# Patient Record
Sex: Female | Born: 1975 | Race: White | Hispanic: No | Marital: Married | Smoking: Never smoker
Health system: Southern US, Community
[De-identification: ages and names within clinical notes are randomized; demographics above are authoritative.]

## PROBLEM LIST (undated history)

## (undated) ENCOUNTER — Inpatient Hospital Stay (HOSPITAL_COMMUNITY): Payer: Self-pay

## (undated) ENCOUNTER — Inpatient Hospital Stay (HOSPITAL_COMMUNITY): Payer: PRIVATE HEALTH INSURANCE

## (undated) DIAGNOSIS — O99345 Other mental disorders complicating the puerperium: Secondary | ICD-10-CM

## (undated) DIAGNOSIS — F329 Major depressive disorder, single episode, unspecified: Secondary | ICD-10-CM

## (undated) DIAGNOSIS — F32A Depression, unspecified: Secondary | ICD-10-CM

## (undated) DIAGNOSIS — F53 Postpartum depression: Secondary | ICD-10-CM

## (undated) DIAGNOSIS — F5105 Insomnia due to other mental disorder: Principal | ICD-10-CM

## (undated) DIAGNOSIS — E039 Hypothyroidism, unspecified: Secondary | ICD-10-CM

## (undated) DIAGNOSIS — F419 Anxiety disorder, unspecified: Secondary | ICD-10-CM

## (undated) HISTORY — DX: Major depressive disorder, single episode, unspecified: F32.9

## (undated) HISTORY — PX: CHOLECYSTECTOMY: SHX55

## (undated) HISTORY — PX: WISDOM TOOTH EXTRACTION: SHX21

## (undated) HISTORY — DX: Other mental disorders complicating the puerperium: O99.345

## (undated) HISTORY — DX: Depression, unspecified: F32.A

## (undated) HISTORY — DX: Anxiety disorder, unspecified: F41.9

## (undated) HISTORY — DX: Postpartum depression: F53.0

## (undated) HISTORY — DX: Hypothyroidism, unspecified: E03.9

## (undated) HISTORY — DX: Insomnia due to other mental disorder: F51.05

---

## 2013-02-13 NOTE — L&D Delivery Note (Signed)
Patient is 38 y.o. [id number] admitted for IOL 2/2 prolonged pregnancy induced with cytotec => AROM   Delivery Note At 6:57 PM a viable female was delivered via Vaginal, Spontaneous Delivery (Presentation: Left Occiput Anterior).  APGAR: 9, 9; weight  pending  Placenta status: Intact, Spontaneous.  Cord: 3 vessels with the following complications: None  Anesthesia: Epidural  Episiotomy: None Lacerations: small hemostatic/well approximated clitoral laceration Suture Repair: none Est. Blood Loss (mL): 300  Mom to postpartum.  Baby to Couplet care / Skin to Skin.  Gabrielle Murphy ROCIO 12/27/2013, 7:57 PM

## 2013-05-09 ENCOUNTER — Ambulatory Visit (INDEPENDENT_AMBULATORY_CARE_PROVIDER_SITE_OTHER): Payer: PRIVATE HEALTH INSURANCE | Admitting: Advanced Practice Midwife

## 2013-05-09 ENCOUNTER — Encounter: Payer: Self-pay | Admitting: Advanced Practice Midwife

## 2013-05-09 VITALS — BP 100/59 | Ht 65.0 in | Wt 142.0 lb

## 2013-05-09 DIAGNOSIS — E039 Hypothyroidism, unspecified: Secondary | ICD-10-CM

## 2013-05-09 DIAGNOSIS — Z3480 Encounter for supervision of other normal pregnancy, unspecified trimester: Secondary | ICD-10-CM

## 2013-05-09 DIAGNOSIS — Z3481 Encounter for supervision of other normal pregnancy, first trimester: Secondary | ICD-10-CM | POA: Insufficient documentation

## 2013-05-09 NOTE — Progress Notes (Signed)
p-63  Last pap 8/14 and wnl     Bedside /S showed IUP with FHT of 163 and CRL of 14.639mm

## 2013-05-09 NOTE — Patient Instructions (Addendum)
Pregnancy - First Trimester  During sexual intercourse, millions of sperm go into the vagina. Only 1 sperm will penetrate and fertilize the female egg while it is in the Fallopian tube. One week later, the fertilized egg implants into the wall of the uterus. An embryo begins to develop into a baby. At 6 to 8 weeks, the eyes and face are formed and the heartbeat can be seen on ultrasound. At the end of 12 weeks (first trimester), all the baby's organs are formed. Now that you are pregnant, you will want to do everything you can to have a healthy baby. Two of the most important things are to get good prenatal care and follow your caregiver's instructions. Prenatal care is all the medical care you receive before the baby's birth. It is given to prevent, find, and treat problems during the pregnancy and childbirth.  PRENATAL EXAMS  · During prenatal visits, your weight, blood pressure, and urine are checked. This is done to make sure you are healthy and progressing normally during the pregnancy.  · A pregnant woman should gain 25 to 35 pounds during the pregnancy. However, if you are overweight or underweight, your caregiver will advise you regarding your weight.  · Your caregiver will ask and answer questions for you.  · Blood work, cervical cultures, other necessary tests, and a Pap test are done during your prenatal exams. These tests are done to check on your health and the probable health of your baby. Tests are strongly recommended and done for HIV with your permission. This is the virus that causes AIDS. These tests are done because medicines can be given to help prevent your baby from being born with this infection should you have been infected without knowing it. Blood work is also used to find out your blood type, previous infections, and follow your blood levels (hemoglobin).  · Low hemoglobin (anemia) is common during pregnancy. Iron and vitamins are given to help prevent this. Later in the pregnancy, blood  tests for diabetes will be done along with any other tests if any problems develop.  · You may need other tests to make sure you and the baby are doing well.  CHANGES DURING THE FIRST TRIMESTER   Your body goes through many changes during pregnancy. They vary from person to person. Talk to your caregiver about changes you notice and are concerned about. Changes can include:  · Your menstrual period stops.  · The egg and sperm carry the genes that determine what you look like. Genes from you and your partner are forming a baby. The female genes determine whether the baby is a boy or a girl.  · Your body increases in girth and you may feel bloated.  · Feeling sick to your stomach (nauseous) and throwing up (vomiting). If the vomiting is uncontrollable, call your caregiver.  · Your breasts will begin to enlarge and become tender.  · Your nipples may stick out more and become darker.  · The need to urinate more. Painful urination may mean you have a bladder infection.  · Tiring easily.  · Loss of appetite.  · Cravings for certain kinds of food.  · At first, you may gain or lose a couple of pounds.  · You may have changes in your emotions from day to day (excited to be pregnant or concerned something may go wrong with the pregnancy and baby).  · You may have more vivid and strange dreams.  HOME CARE INSTRUCTIONS   ·   It is very important to avoid all smoking, alcohol and non-prescribed drugs during your pregnancy. These affect the formation and growth of the baby. Avoid chemicals while pregnant to ensure the delivery of a healthy infant.  · Start your prenatal visits by the 12th week of pregnancy. They are usually scheduled monthly at first, then more often in the last 2 months before delivery. Keep your caregiver's appointments. Follow your caregiver's instructions regarding medicine use, blood and lab tests, exercise, and diet.  · During pregnancy, you are providing food for you and your baby. Eat regular, well-balanced  meals. Choose foods such as meat, fish, milk and other low fat dairy products, vegetables, fruits, and whole-grain breads and cereals. Your caregiver will tell you of the ideal weight gain.  · You can help morning sickness by keeping soda crackers at the bedside. Eat a couple before arising in the morning. You may want to use the crackers without salt on them.  · Eating 4 to 5 small meals rather than 3 large meals a day also may help the nausea and vomiting.  · Drinking liquids between meals instead of during meals also seems to help nausea and vomiting.  · A physical sexual relationship may be continued throughout pregnancy if there are no other problems. Problems may be early (premature) leaking of amniotic fluid from the membranes, vaginal bleeding, or belly (abdominal) pain.  · Exercise regularly if there are no restrictions. Check with your caregiver or physical therapist if you are unsure of the safety of some of your exercises. Greater weight gain will occur in the last 2 trimesters of pregnancy. Exercising will help:  · Control your weight.  · Keep you in shape.  · Prepare you for labor and delivery.  · Help you lose your pregnancy weight after you deliver your baby.  · Wear a good support or jogging bra for breast tenderness during pregnancy. This may help if worn during sleep too.  · Ask when prenatal classes are available. Begin classes when they are offered.  · Do not use hot tubs, steam rooms, or saunas.  · Wear your seat belt when driving. This protects you and your baby if you are in an accident.  · Avoid raw meat, uncooked cheese, cat litter boxes, and soil used by cats throughout the pregnancy. These carry germs that can cause birth defects in the baby.  · The first trimester is a good time to visit your dentist for your dental health. Getting your teeth cleaned is okay. Use a softer toothbrush and brush gently during pregnancy.  · Ask for help if you have financial, counseling, or nutritional needs  during pregnancy. Your caregiver will be able to offer counseling for these needs as well as refer you for other special needs.  · Do not take any medicines or herbs unless told by your caregiver.  · Inform your caregiver if there is any mental or physical domestic violence.  · Make a list of emergency phone numbers of family, friends, hospital, and police and fire departments.  · Write down your questions. Take them to your prenatal visit.  · Do not douche.  · Do not cross your legs.  · If you have to stand for long periods of time, rotate you feet or take small steps in a circle.  · You may have more vaginal secretions that may require a sanitary pad. Do not use tampons or scented sanitary pads.  MEDICINES AND DRUG USE IN PREGNANCY  ·   Take prenatal vitamins as directed. The vitamin should contain 1 milligram of folic acid. Keep all vitamins out of reach of children. Only a couple vitamins or tablets containing iron may be fatal to a baby or young child when ingested.  · Avoid use of all medicines, including herbs, over-the-counter medicines, not prescribed or suggested by your caregiver. Only take over-the-counter or prescription medicines for pain, discomfort, or fever as directed by your caregiver. Do not use aspirin, ibuprofen, or naproxen unless directed by your caregiver.  · Let your caregiver also know about herbs you may be using.  · Alcohol is related to a number of birth defects. This includes fetal alcohol syndrome. All alcohol, in any form, should be avoided completely. Smoking will cause low birth rate and premature babies.  · Street or illegal drugs are very harmful to the baby. They are absolutely forbidden. A baby born to an addicted mother will be addicted at birth. The baby will go through the same withdrawal an adult does.  · Let your caregiver know about any medicines that you have to take and for what reason you take them.  SEEK MEDICAL CARE IF:   You have any concerns or worries during your  pregnancy. It is better to call with your questions if you feel they cannot wait, rather than worry about them.  SEEK IMMEDIATE MEDICAL CARE IF:   · An unexplained oral temperature above 102° F (38.9° C) develops, or as your caregiver suggests.  · You have leaking of fluid from the vagina (birth canal). If leaking membranes are suspected, take your temperature and inform your caregiver of this when you call.  · There is vaginal spotting or bleeding. Notify your caregiver of the amount and how many pads are used.  · You develop a bad smelling vaginal discharge with a change in the color.  · You continue to feel sick to your stomach (nauseated) and have no relief from remedies suggested. You vomit blood or coffee ground-like materials.  · You lose more than 2 pounds of weight in 1 week.  · You gain more than 2 pounds of weight in 1 week and you notice swelling of your face, hands, feet, or legs.  · You gain 5 pounds or more in 1 week (even if you do not have swelling of your hands, face, legs, or feet).  · You get exposed to German measles and have never had them.  · You are exposed to fifth disease or chickenpox.  · You develop belly (abdominal) pain. Round ligament discomfort is a common non-cancerous (benign) cause of abdominal pain in pregnancy. Your caregiver still must evaluate this.  · You develop headache, fever, diarrhea, pain with urination, or shortness of breath.  · You fall or are in a car accident or have any kind of trauma.  · There is mental or physical violence in your home.  Document Released: 01/24/2001 Document Revised: 10/25/2011 Document Reviewed: 07/28/2008  Exit[employer]® Patient Information ©2014 [employer].

## 2013-05-09 NOTE — Progress Notes (Signed)
   Subjective:    Gabrielle Murphy is a [id number] [id number] being seen today for her first obstetrical visit.  Her obstetrical history is significant for advanced maternal age. Patient does intend to breast feed. Pregnancy history fully reviewed.  Patient reports no complaints.  Was told by naturopath that she was hypothyroid. Put her on a "natural" thyroid med. She stopped taking it for pregnancy.  No symptoms  Filed Vitals:   05/09/13 0900 05/09/13 0908  BP: 100/59   Height:  5\' 5"  (1.651 m)  Weight: 64.411 kg (142 lb)     HISTORY: OB History  Gravida Para Term Preterm AB SAB TAB Ectopic Multiple Living  3 2 2       2     # Outcome Date GA Lbr Len/2nd Weight Sex Delivery Anes PTL Lv  3 CUR           2 TRM 04/19/05 4237w0d  3.572 kg (7 lb 14 oz) M SVD EPI N Y  1 TRM 08/12/02 4847w0d  3.232 kg (7 lb 2 oz) F SVD EPI N Y     Past Medical History  Diagnosis Date  . Hypothyroid     DX thru integrated Dr   Past Surgical History  Procedure Laterality Date  . Cholecystectomy    . Wisdom tooth extraction     Family History  Problem Relation Age of Onset  . Spina bifida Sister   . Cancer Maternal Grandmother     pancreatic  . Cancer Paternal Grandfather     esophagus  . Cancer Maternal Grandmother   . Heart disease Maternal Grandmother      Exam    Uterus:     Pelvic Exam:    Perineum: No Hemorrhoids, Normal Perineum   Vulva: Bartholin's, Urethra, Skene's normal   Vagina:  normal mucosa, normal discharge   pH:    Cervix: multiparous appearance   Adnexa: normal adnexa and no mass, fullness, tenderness   Bony Pelvis: gynecoid  System: Breast:  normal appearance, no masses or tenderness   Skin: normal coloration and turgor, no rashes    Neurologic: oriented, no focal deficits   Extremities: normal strength, tone, and muscle mass   HEENT neck supple with midline trachea   Mouth/Teeth mucous membranes moist, pharynx normal without lesions   Neck supple and no masses   Cardiovascular: regular rate and rhythm, no murmurs or gallops   Respiratory:  appears well, vitals normal, no respiratory distress, acyanotic, normal RR, ear and throat exam is normal, neck free of mass or lymphadenopathy, chest clear, no wheezing, crepitations, rhonchi, normal symmetric air entry   Abdomen: soft, non-tender; bowel sounds normal; no masses,  no organomegaly   Urinary: urethral meatus normal      Assessment:    Pregnancy: G3P2002 There are no active problems to display for this patient.       Plan:     Initial labs drawn. Prenatal vitamins. Problem list reviewed and updated. Genetic Screening discussed First Screen: Undecided.  Ultrasound discussed; fetal survey: requested.  Follow up in 4 weeks. 50% of 30 min visit spent on counseling and coordination of care.   Will check TSH today to assess condition  [facility] CenterWILLIAMS,Muhsin Doris 05/09/2013

## 2013-05-10 LAB — OBSTETRIC PANEL
ANTIBODY SCREEN: NEGATIVE
Basophils Absolute: 0 10*3/uL (ref 0.0–0.1)
Basophils Relative: 0 % (ref 0–1)
Eosinophils Absolute: 0.2 10*3/uL (ref 0.0–0.7)
Eosinophils Relative: 2 % (ref 0–5)
HEMATOCRIT: 38.6 % (ref 36.0–46.0)
HEMOGLOBIN: 13.8 g/dL (ref 12.0–15.0)
Hepatitis B Surface Ag: NEGATIVE
Lymphocytes Relative: 17 % (ref 12–46)
Lymphs Abs: 1.3 10*3/uL (ref 0.7–4.0)
MCH: 31.7 pg (ref 26.0–34.0)
MCHC: 35.8 g/dL (ref 30.0–36.0)
MCV: 88.5 fL (ref 78.0–100.0)
MONO ABS: 0.4 10*3/uL (ref 0.1–1.0)
MONOS PCT: 5 % (ref 3–12)
Neutro Abs: 5.9 10*3/uL (ref 1.7–7.7)
Neutrophils Relative %: 76 % (ref 43–77)
Platelets: 211 10*3/uL (ref 150–400)
RBC: 4.36 MIL/uL (ref 3.87–5.11)
RDW: 13.3 % (ref 11.5–15.5)
RH TYPE: POSITIVE
Rubella: 1.25 Index — ABNORMAL HIGH (ref <0.90)
WBC: 7.8 10*3/uL (ref 4.0–10.5)

## 2013-05-10 LAB — GC/CHLAMYDIA PROBE AMP
CT Probe RNA: NEGATIVE
GC PROBE AMP APTIMA: NEGATIVE

## 2013-05-10 LAB — HIV ANTIBODY (ROUTINE TESTING W REFLEX): HIV: NONREACTIVE

## 2013-05-10 LAB — TSH: TSH: 2.376 u[IU]/mL (ref 0.350–4.500)

## 2013-05-11 LAB — CULTURE, OB URINE
Colony Count: NO GROWTH
Organism ID, Bacteria: NO GROWTH

## 2013-05-16 ENCOUNTER — Encounter: Payer: Self-pay | Admitting: Advanced Practice Midwife

## 2013-06-06 ENCOUNTER — Ambulatory Visit (INDEPENDENT_AMBULATORY_CARE_PROVIDER_SITE_OTHER): Payer: PRIVATE HEALTH INSURANCE | Admitting: Family

## 2013-06-06 VITALS — BP 104/64 | HR 67 | Wt 148.0 lb

## 2013-06-06 DIAGNOSIS — Z3481 Encounter for supervision of other normal pregnancy, first trimester: Secondary | ICD-10-CM

## 2013-06-06 DIAGNOSIS — Z3480 Encounter for supervision of other normal pregnancy, unspecified trimester: Secondary | ICD-10-CM

## 2013-06-06 NOTE — Progress Notes (Signed)
Doing well.  Reviewed lab results.  Pt declines genetic testing at this time.

## 2013-07-04 ENCOUNTER — Encounter: Payer: Self-pay | Admitting: *Deleted

## 2013-07-04 ENCOUNTER — Ambulatory Visit (INDEPENDENT_AMBULATORY_CARE_PROVIDER_SITE_OTHER): Payer: PRIVATE HEALTH INSURANCE | Admitting: Family

## 2013-07-04 VITALS — BP 113/71 | HR 68 | Wt 155.0 lb

## 2013-07-04 DIAGNOSIS — Z3480 Encounter for supervision of other normal pregnancy, unspecified trimester: Secondary | ICD-10-CM

## 2013-07-04 DIAGNOSIS — Z3490 Encounter for supervision of normal pregnancy, unspecified, unspecified trimester: Secondary | ICD-10-CM

## 2013-07-04 NOTE — Progress Notes (Signed)
Reports increased nasal congestion due to allergies and difficulty staying sleep at night.  Recommended benadryl, will start with 12.5 mg.  Concerned about weight gain (19 lbs).  Event organiser and works out 4x/week.  Feels hungry > recommended listening to body.  Schedule anatomy ultrasound.

## 2013-07-14 ENCOUNTER — Telehealth: Payer: Self-pay | Admitting: *Deleted

## 2013-07-14 ENCOUNTER — Inpatient Hospital Stay (HOSPITAL_COMMUNITY)
Admission: AD | Admit: 2013-07-14 | Discharge: 2013-07-14 | Disposition: A | Discharge status: Home / Self Care | Payer: No Typology Code available for payment source | Source: Ambulatory Visit | Attending: Obstetrics & Gynecology | Admitting: Obstetrics & Gynecology

## 2013-07-14 ENCOUNTER — Encounter (HOSPITAL_COMMUNITY): Payer: Self-pay | Admitting: General Practice

## 2013-07-14 DIAGNOSIS — N390 Urinary tract infection, site not specified: Secondary | ICD-10-CM | POA: Insufficient documentation

## 2013-07-14 DIAGNOSIS — O2390 Unspecified genitourinary tract infection in pregnancy, unspecified trimester: Secondary | ICD-10-CM | POA: Diagnosis not present

## 2013-07-14 DIAGNOSIS — R109 Unspecified abdominal pain: Secondary | ICD-10-CM | POA: Insufficient documentation

## 2013-07-14 DIAGNOSIS — E860 Dehydration: Secondary | ICD-10-CM | POA: Diagnosis not present

## 2013-07-14 DIAGNOSIS — K5289 Other specified noninfective gastroenteritis and colitis: Secondary | ICD-10-CM | POA: Insufficient documentation

## 2013-07-14 DIAGNOSIS — O26899 Other specified pregnancy related conditions, unspecified trimester: Secondary | ICD-10-CM

## 2013-07-14 DIAGNOSIS — O9989 Other specified diseases and conditions complicating pregnancy, childbirth and the puerperium: Secondary | ICD-10-CM

## 2013-07-14 LAB — URINALYSIS, ROUTINE W REFLEX MICROSCOPIC
Bilirubin Urine: NEGATIVE
Glucose, UA: NEGATIVE mg/dL
HGB URINE DIPSTICK: NEGATIVE
Ketones, ur: 15 mg/dL — AB
Leukocytes, UA: NEGATIVE
NITRITE: NEGATIVE
Protein, ur: NEGATIVE mg/dL
SPECIFIC GRAVITY, URINE: 1.015 (ref 1.005–1.030)
Urobilinogen, UA: 0.2 mg/dL (ref 0.0–1.0)
pH: 6 (ref 5.0–8.0)

## 2013-07-14 LAB — BASIC METABOLIC PANEL
BUN: 10 mg/dL (ref 6–23)
CHLORIDE: 100 meq/L (ref 96–112)
CO2: 25 mEq/L (ref 19–32)
Calcium: 9.2 mg/dL (ref 8.4–10.5)
Creatinine, Ser: 0.59 mg/dL (ref 0.50–1.10)
GFR calc Af Amer: >90 mL/min (ref >90)
GLUCOSE: 91 mg/dL (ref 70–99)
POTASSIUM: 3.9 meq/L (ref 3.7–5.3)
Sodium: 135 mEq/L — ABNORMAL LOW (ref 137–147)

## 2013-07-14 LAB — CBC WITH DIFFERENTIAL/PLATELET
Basophils Absolute: 0 10*3/uL (ref 0.0–0.1)
Basophils Relative: 0 % (ref 0–1)
EOS ABS: 0.2 10*3/uL (ref 0.0–0.7)
Eosinophils Relative: 1 % (ref 0–5)
HCT: 33.4 % — ABNORMAL LOW (ref 36.0–46.0)
HEMOGLOBIN: 12 g/dL (ref 12.0–15.0)
LYMPHS ABS: 1.5 10*3/uL (ref 0.7–4.0)
Lymphocytes Relative: 14 % (ref 12–46)
MCH: 32.7 pg (ref 26.0–34.0)
MCHC: 35.9 g/dL (ref 30.0–36.0)
MCV: 91 fL (ref 78.0–100.0)
Monocytes Absolute: 0.5 10*3/uL (ref 0.1–1.0)
Monocytes Relative: 4 % (ref 3–12)
NEUTROS PCT: 81 % — AB (ref 43–77)
Neutro Abs: 8.6 10*3/uL — ABNORMAL HIGH (ref 1.7–7.7)
Platelets: 167 10*3/uL (ref 150–400)
RBC: 3.67 MIL/uL — AB (ref 3.87–5.11)
RDW: 12.8 % (ref 11.5–15.5)
WBC: 10.7 10*3/uL — ABNORMAL HIGH (ref 4.0–10.5)

## 2013-07-14 MED ORDER — IBUPROFEN 600 MG PO TABS
600.0000 mg | ORAL_TABLET | Freq: Once | ORAL | Status: AC
  Administered 2013-07-14: 600 mg via ORAL
  Filled 2013-07-14: qty 1

## 2013-07-14 MED ORDER — DICYCLOMINE HCL 20 MG PO TABS: 20.0000 mg | ORAL_TABLET | Freq: Once | ORAL | Status: DC

## 2013-07-14 MED ORDER — ACETAMINOPHEN 325 MG PO TABS: 650.0000 mg | ORAL_TABLET | Freq: Once | ORAL | Status: DC

## 2013-07-14 MED ORDER — M.V.I. ADULT IV INJ
Freq: Once | INTRAVENOUS | Status: AC
  Administered 2013-07-14: 16:00:00 via INTRAVENOUS
  Filled 2013-07-14: qty 10

## 2013-07-14 NOTE — MAU Provider Note (Signed)
History     CSN: [id number]  Arrival date and time: 07/14/13 1348   First Provider Initiated Contact with Patient 07/14/13 1451      Chief Complaint: Nausea /Abdominal Cramping  HPI Comments: [name] is a 38 yo caucasian female who is G3P2002 and17 weeks and 2 days presenting to MAU today complaining of nausea and abdominal cramping.  Patient describes the pain as intermittent, isolated to her suprapubic area and rates it a 6/10 .  Patient admits to a "stomach flu" beginning Tuesday and lasting 3 days causing copious N/V/D.  Despite trying to stay hydrated patient states she constantly feels thirsty and complains of nausea.  Pt was unsure of fever, but admits to chills and malaise.  Pt denies dysuria, hematuria, urgency, or frequency.    OB History   Grav Para Term Preterm Abortions TAB SAB Ect Mult Living   3 2 2       2       Past Medical History  Diagnosis Date  . Hypothyroid     DX thru integrated Dr    Past Surgical History  Procedure Laterality Date  . Cholecystectomy    . Wisdom tooth extraction      Family History  Problem Relation Age of Onset  . Spina bifida Sister   . Cancer Maternal Grandmother     pancreatic  . Cancer Paternal Grandfather     esophagus  . Cancer Maternal Grandmother   . Heart disease Maternal Grandmother     History  Substance Use Topics  . Smoking status: Never Smoker   . Smokeless tobacco: Never Used  . Alcohol Use: No    Allergies:  Allergies  Allergen Reactions  . Penicillins Rash    Prescriptions prior to admission  Medication Sig Dispense Refill  . acetaminophen (TYLENOL) 325 MG tablet Take 650 mg by mouth every 6 (six) hours as needed.      . cholecalciferol (VITAMIN D) 1000 UNITS tablet Take 1,000 Units by mouth daily.      . Omega-3 Fatty Acids (FISH OIL) 1000 MG CAPS Take by mouth daily.      . Prenatal Multivit-Min-Fe-FA (PRENATAL VITAMINS PO) Take by mouth daily.        Review of Systems  Constitutional:  Positive for malaise/fatigue.  HENT: Negative.   Respiratory: Negative.   Cardiovascular: Negative.   Gastrointestinal: Positive for nausea and abdominal pain.  Genitourinary: Negative for dysuria, urgency, frequency and hematuria.  Musculoskeletal: Negative.   Skin: Negative.    Physical Exam   Blood pressure 98/44, pulse 86, resp. rate 16, last menstrual period 03/15/2013, SpO2 100.00%.  Physical Exam  Constitutional: She is oriented to person, place, and time. She appears well-developed and well-nourished.  Cardiovascular: Normal rate, regular rhythm and normal heart sounds.   Respiratory: Effort normal and breath sounds normal.  GI: Soft. Bowel sounds are normal. She exhibits no distension. There is tenderness in the suprapubic area. There is no rebound and no guarding.  Genitourinary: Vagina normal and uterus normal.  Cervix was long and closed upon examination   Neurological: She is alert and oriented to person, place, and time.  In minor distress  Skin: Skin is warm and dry.  No tenting    MAU Course  Procedures none MDM CBC- check leukocytes r/o appendicitis  CMP- check K+ levels IV fluids with MVI   Assessment and Plan  -  Post-gastroenteritis dehydration  -  Pregnancy associated abdominal cramping -  UTI -  Appendicitis  Carson L Antis 07/14/2013, 3:11 PM   Seen and examined by me also Agree with note Will give one dose of ibuprofen for uterine cramping. >> felt better after med and IVF.  Wants to go home.  P:  Discharge home       Advance diet as tolerated       followup in office  Aviva Signs, CNM

## 2013-07-14 NOTE — Telephone Encounter (Signed)
Pt called with increasing lower back and cramping pain.  Has not noticed any bleeding and is keeping herself well hydrated.  She states that the pain has gotten progressively strong as the morning as gone on.  Suggested that she go to MAU for evaluation as there is no provider in [place] this afternoon.  Pt agrees and will have husband take her over to MAU.

## 2013-07-14 NOTE — Discharge Instructions (Signed)
Abdominal Pain During Pregnancy  Belly (abdominal) pain is common during pregnancy. Most of the time, it is not a serious problem. Other times, it can be a sign that something is wrong with the pregnancy. Always tell your doctor if you have belly pain.  HOME CARE  Monitor your belly pain for any changes. The following actions may help you feel better:  · Do not have sex (intercourse) or put anything in your vagina until you feel better.  · Rest until your pain stops.  · Drink clear fluids if you feel sick to your stomach (nauseous). Do not eat solid food until you feel better.  · Only take medicine as told by your doctor.  · Keep all doctor visits as told.  GET HELP RIGHT AWAY IF:   · You are bleeding, leaking fluid, or pieces of tissue come out of your vagina.  · You have more pain or cramping.  · You keep throwing up (vomiting).  · You have pain when you pee (urinate) or have blood in your pee.  · You have a fever.  · You do not feel your baby moving as much.  · You feel very weak or feel like passing out.  · You have trouble breathing, with or without belly pain.  · You have a very bad headache and belly pain.  · You have fluid leaking from your vagina and belly pain.  · You keep having watery poop (diarrhea).  · Your belly pain does not go away after resting, or the pain gets worse.  MAKE SURE YOU:   · Understand these instructions.  · Will watch your condition.  · Will get help right away if you are not doing well or get worse.  Document Released: 01/18/2009 Document Revised: 10/02/2012 Document Reviewed: 08/29/2012  [employer]® Patient Information ©2014 [employer].

## 2013-07-14 NOTE — MAU Note (Signed)
Pt presents to MAU with c/o lower abdominal cramping that started around 1230 AM and has gotten worse throughout the day especially with activity. Pt states she has had a stomach virus last week since Tuesday that she vomited 9-10x and had diarrhea many times as well. She had nausea for three days after the initial onset without vomiting but did had continued diarrhea. Pt is concerned about the cramping, stomach tightening and her hydration status.

## 2013-07-15 ENCOUNTER — Ambulatory Visit (INDEPENDENT_AMBULATORY_CARE_PROVIDER_SITE_OTHER): Payer: PRIVATE HEALTH INSURANCE | Admitting: Nurse Practitioner

## 2013-07-15 VITALS — BP 106/68 | HR 73 | Wt 149.0 lb

## 2013-07-15 DIAGNOSIS — O479 False labor, unspecified: Secondary | ICD-10-CM

## 2013-07-15 DIAGNOSIS — Z3480 Encounter for supervision of other normal pregnancy, unspecified trimester: Secondary | ICD-10-CM

## 2013-07-15 MED ORDER — OXYCODONE-ACETAMINOPHEN 5-325 MG PO TABS
2.0000 | ORAL_TABLET | ORAL | Status: DC | PRN
Start: 2013-07-15 — End: 2013-09-26

## 2013-07-15 MED ORDER — PROMETHAZINE HCL 25 MG PO TABS: 25.0000 mg | ORAL_TABLET | Freq: Four times a day (QID) | ORAL | Status: DC | PRN

## 2013-07-15 NOTE — Progress Notes (Signed)
Contractions throughout the night and was told to come to office if things did not get any better.  Seen in MAU yesterday.

## 2013-07-15 NOTE — Patient Instructions (Signed)

## 2013-07-15 NOTE — Progress Notes (Signed)
Seen in MAU yesterday for dehydration follow GI bug. Was given 1 liter of fluid and told to follow up in office if not better. She is still having cramping and would like cervix checked. She will be give percocet/ phenergan to go home and rest with fluids. She will follow up in MAU or office if this is not helpful. Bedside ultrasound was done by RN

## 2013-07-29 ENCOUNTER — Ambulatory Visit (HOSPITAL_COMMUNITY)
Admission: RE | Admit: 2013-07-29 | Discharge: 2013-07-29 | Disposition: A | Discharge status: Home / Self Care | Payer: No Typology Code available for payment source | Source: Ambulatory Visit | Attending: Family | Admitting: Family

## 2013-07-29 DIAGNOSIS — Z3490 Encounter for supervision of normal pregnancy, unspecified, unspecified trimester: Secondary | ICD-10-CM

## 2013-07-29 DIAGNOSIS — Z3689 Encounter for other specified antenatal screening: Secondary | ICD-10-CM | POA: Insufficient documentation

## 2013-07-31 ENCOUNTER — Encounter: Payer: Self-pay | Admitting: Family

## 2013-08-01 ENCOUNTER — Ambulatory Visit (INDEPENDENT_AMBULATORY_CARE_PROVIDER_SITE_OTHER): Payer: PRIVATE HEALTH INSURANCE | Admitting: Advanced Practice Midwife

## 2013-08-01 VITALS — BP 96/63 | HR 78 | Wt 156.0 lb

## 2013-08-01 DIAGNOSIS — Z3492 Encounter for supervision of normal pregnancy, unspecified, second trimester: Secondary | ICD-10-CM

## 2013-08-01 DIAGNOSIS — Z3480 Encounter for supervision of other normal pregnancy, unspecified trimester: Secondary | ICD-10-CM

## 2013-08-01 NOTE — Progress Notes (Signed)
Doing well, but tired. Some ligament pain. Discussed maternity belt. Next visit need to schedule F/U US for late July to complete anatomy

## 2013-08-01 NOTE — Patient Instructions (Signed)
Second Trimester of Pregnancy The second trimester is from week 13 through week 28, months 4 through 6. The second trimester is often a time when you feel your best. Your body has also adjusted to being pregnant, and you begin to feel better physically. Usually, morning sickness has lessened or quit completely, you may have more energy, and you may have an increase in appetite. The second trimester is also a time when the fetus is growing rapidly. At the end of the sixth month, the fetus is about 9 inches long and weighs about 1 pounds. You will likely begin to feel the baby move (quickening) between 18 and 20 weeks of the pregnancy. BODY CHANGES Your body goes through many changes during pregnancy. The changes vary from woman to woman.   Your weight will continue to increase. You will notice your lower abdomen bulging out.  You may begin to get stretch marks on your hips, abdomen, and breasts.  You may develop headaches that can be relieved by medicines approved by your health care provider.  You may urinate more often because the fetus is pressing on your bladder.  You may develop or continue to have heartburn as a result of your pregnancy.  You may develop constipation because certain hormones are causing the muscles that push waste through your intestines to slow down.  You may develop hemorrhoids or swollen, bulging veins (varicose veins).  You may have back pain because of the weight gain and pregnancy hormones relaxing your joints between the bones in your pelvis and as a result of a shift in weight and the muscles that support your balance.  Your breasts will continue to grow and be tender.  Your gums may bleed and may be sensitive to brushing and flossing.  Dark spots or blotches (chloasma, mask of pregnancy) may develop on your face. This will likely fade after the baby is born.  A dark line from your belly button to the pubic area (linea nigra) may appear. This will likely fade  after the baby is born.  You may have changes in your hair. These can include thickening of your hair, rapid growth, and changes in texture. Some women also have hair loss during or after pregnancy, or hair that feels dry or thin. Your hair will most likely return to normal after your baby is born. WHAT TO EXPECT AT YOUR PRENATAL VISITS During a routine prenatal visit:  You will be weighed to make sure you and the fetus are growing normally.  Your blood pressure will be taken.  Your abdomen will be measured to track your baby's growth.  The fetal heartbeat will be listened to.  Any test results from the previous visit will be discussed. Your health care provider may ask you:  How you are feeling.  If you are feeling the baby move.  If you have had any abnormal symptoms, such as leaking fluid, bleeding, severe headaches, or abdominal cramping.  If you have any questions. Other tests that may be performed during your second trimester include:  Blood tests that check for:  Low iron levels (anemia).  Gestational diabetes (between 24 and 28 weeks).  Rh antibodies.  Urine tests to check for infections, diabetes, or protein in the urine.  An ultrasound to confirm the proper growth and development of the baby.  An amniocentesis to check for possible genetic problems.  Fetal screens for spina bifida and Down syndrome. HOME CARE INSTRUCTIONS   Avoid all smoking, herbs, alcohol, and unprescribed   drugs. These chemicals affect the formation and growth of the baby.  Follow your health care provider's instructions regarding medicine use. There are medicines that are either safe or unsafe to take during pregnancy.  Exercise only as directed by your health care provider. Experiencing uterine cramps is a good sign to stop exercising.  Continue to eat regular, healthy meals.  Wear a good support bra for breast tenderness.  Do not use hot tubs, steam rooms, or saunas.  Wear your  seat belt at all times when driving.  Avoid raw meat, uncooked cheese, cat litter boxes, and soil used by cats. These carry germs that can cause birth defects in the baby.  Take your prenatal vitamins.  Try taking a stool softener (if your health care provider approves) if you develop constipation. Eat more high-fiber foods, such as fresh vegetables or fruit and whole grains. Drink plenty of fluids to keep your urine clear or pale yellow.  Take warm sitz baths to soothe any pain or discomfort caused by hemorrhoids. Use hemorrhoid cream if your health care provider approves.  If you develop varicose veins, wear support hose. Elevate your feet for 15 minutes, 3-4 times a day. Limit salt in your diet.  Avoid heavy lifting, wear low heel shoes, and practice good posture.  Rest with your legs elevated if you have leg cramps or low back pain.  Visit your dentist if you have not gone yet during your pregnancy. Use a soft toothbrush to brush your teeth and be gentle when you floss.  A sexual relationship may be continued unless your health care provider directs you otherwise.  Continue to go to all your prenatal visits as directed by your health care provider. SEEK MEDICAL CARE IF:   You have dizziness.  You have mild pelvic cramps, pelvic pressure, or nagging pain in the abdominal area.  You have persistent nausea, vomiting, or diarrhea.  You have a bad smelling vaginal discharge.  You have pain with urination. SEEK IMMEDIATE MEDICAL CARE IF:   You have a fever.  You are leaking fluid from your vagina.  You have spotting or bleeding from your vagina.  You have severe abdominal cramping or pain.  You have rapid weight gain or loss.  You have shortness of breath with chest pain.  You notice sudden or extreme swelling of your face, hands, ankles, feet, or legs.  You have not felt your baby move in over an hour.  You have severe headaches that do not go away with  medicine.  You have vision changes. Document Released: 01/24/2001 Document Revised: 02/04/2013 Document Reviewed: 04/02/2012 Exit[employer] Patient Information 2015 [employer]. This information is not intended to replace advice given to you by your health care provider. Make sure you discuss any questions you have with your health care provider.  

## 2013-08-29 ENCOUNTER — Ambulatory Visit (INDEPENDENT_AMBULATORY_CARE_PROVIDER_SITE_OTHER): Payer: PRIVATE HEALTH INSURANCE | Admitting: Advanced Practice Midwife

## 2013-08-29 VITALS — BP 96/57 | HR 70 | Wt 163.0 lb

## 2013-08-29 DIAGNOSIS — O09522 Supervision of elderly multigravida, second trimester: Secondary | ICD-10-CM

## 2013-08-29 DIAGNOSIS — Z3481 Encounter for supervision of other normal pregnancy, first trimester: Secondary | ICD-10-CM

## 2013-08-29 DIAGNOSIS — O09529 Supervision of elderly multigravida, unspecified trimester: Secondary | ICD-10-CM

## 2013-08-29 DIAGNOSIS — Z3480 Encounter for supervision of other normal pregnancy, unspecified trimester: Secondary | ICD-10-CM

## 2013-08-29 NOTE — Progress Notes (Signed)
Episodes of insomnia

## 2013-08-29 NOTE — Progress Notes (Signed)
US scheduled to complete anatomy. [facility]. Tdap info given.

## 2013-08-29 NOTE — Patient Instructions (Signed)
Tetanus, Diphtheria, Pertussis (Tdap) Vaccine  What You Need to Know  WHY GET VACCINATED?  Tetanus, diphtheria and pertussis can be very serious diseases, even for adolescents and adults. Tdap vaccine can protect us from these diseases.  TETANUS (Lockjaw) causes painful muscle tightening and stiffness, usually all over the body.  · It can lead to tightening of muscles in the head and neck so you can't open your mouth, swallow, or sometimes even breathe. Tetanus kills about 1 out of 5 people who are infected.  DIPHTHERIA can cause a thick coating to form in the back of the throat.  · It can lead to breathing problems, paralysis, heart failure, and death.  PERTUSSIS (Whooping Cough) causes severe coughing spells, which can cause difficulty breathing, vomiting and disturbed sleep.  · It can also lead to weight loss, incontinence, and rib fractures. Up to 2 in 100 adolescents and 5 in 100 adults with pertussis are hospitalized or have complications, which could include pneumonia and death.  These diseases are caused by bacteria. Diphtheria and pertussis are spread from person to person through coughing or sneezing. Tetanus enters the body through cuts, scratches, or wounds.  Before vaccines, the [place] saw as many as 200,000 cases a year of diphtheria and pertussis, and hundreds of cases of tetanus. Since vaccination began, tetanus and diphtheria have dropped by about 99% and pertussis by about 80%.  TDAP VACCINE  Tdap vaccine can protect adolescents and adults from tetanus, diphtheria, and pertussis. One dose of Tdap is routinely given at age 11 or 12. People who did not get Tdap at that age should get it as soon as possible.  Tdap is especially important for health care professionals and anyone having close contact with a baby younger than 12 months.  Pregnant women should get a dose of Tdap during every pregnancy, to protect the newborn from pertussis. Infants are most at risk for severe, life-threatening  complications from pertussis.  A similar vaccine, called Td, protects from tetanus and diphtheria, but not pertussis. A Td booster should be given every 10 years. Tdap may be given as one of these boosters if you have not already gotten a dose. Tdap may also be given after a severe cut or burn to prevent tetanus infection.  Your doctor can give you more information.  Tdap may safely be given at the same time as other vaccines.  SOME PEOPLE SHOULD NOT GET THIS VACCINE  · If you ever had a life-threatening allergic reaction after a dose of any tetanus, diphtheria, or pertussis containing vaccine, OR if you have a severe allergy to any part of this vaccine, you should not get Tdap. Tell your doctor if you have any severe allergies.  · If you had a coma, or long or multiple seizures within 7 days after a childhood dose of DTP or DTaP, you should not get Tdap, unless a cause other than the vaccine was found. You can still get Td.  · Talk to your doctor if you:  ¨ have epilepsy or another nervous system problem,  ¨ had severe pain or swelling after any vaccine containing diphtheria, tetanus or pertussis,  ¨ ever had Guillain-Barré Syndrome (GBS),  ¨ aren't feeling well on the day the shot is scheduled.  RISKS OF A VACCINE REACTION  With any medicine, including vaccines, there is a chance of side effects. These are usually mild and go away on their own, but serious reactions are also possible.  Brief fainting spells can follow   a vaccination, leading to injuries from falling. Sitting or lying down for about 15 minutes can help prevent these. Tell your doctor if you feel dizzy or light-headed, or have vision changes or ringing in the ears.  Mild problems following Tdap (Did not interfere with activities)  · Pain where the shot was given (about 3 in 4 adolescents or 2 in 3 adults)  · Redness or swelling where the shot was given (about 1 person in 5)  · Mild fever of at least 100.4°F (up to about 1 in 25 adolescents or 1 in  100 adults)  · Headache (about 3 or 4 people in 10)  · Tiredness (about 1 person in 3 or 4)  · Nausea, vomiting, diarrhea, stomach ache (up to 1 in 4 adolescents or 1 in 10 adults)  · Chills, body aches, sore joints, rash, swollen glands (uncommon)  Moderate problems following Tdap (Interfered with activities, but did not require medical attention)  · Pain where the shot was given (about 1 in 5 adolescents or 1 in 100 adults)  · Redness or swelling where the shot was given (up to about 1 in 16 adolescents or 1 in 25 adults)  · Fever over 102°F (about 1 in 100 adolescents or 1 in 250 adults)  · Headache (about 3 in 20 adolescents or 1 in 10 adults)  · Nausea, vomiting, diarrhea, stomach ache (up to 1 or 3 people in 100)  · Swelling of the entire arm where the shot was given (up to about 3 in 100).  Severe problems following Tdap (Unable to perform usual activities, required medical attention)  · Swelling, severe pain, bleeding and redness in the arm where the shot was given (rare).  A severe allergic reaction could occur after any vaccine (estimated less than 1 in a million doses).  WHAT IF THERE IS A SERIOUS REACTION?  What should I look for?  · Look for anything that concerns you, such as signs of a severe allergic reaction, very high fever, or behavior changes.  Signs of a severe allergic reaction can include hives, swelling of the face and throat, difficulty breathing, a fast heartbeat, dizziness, and weakness. These would start a few minutes to a few hours after the vaccination.  What should I do?  · If you think it is a severe allergic reaction or other emergency that can't wait, call 9-1-1 or get the person to the nearest hospital. Otherwise, call your doctor.  · Afterward, the reaction should be reported to the "Vaccine Adverse Event Reporting System" (VAERS). Your doctor might file this report, or you can do it yourself through the VAERS web site at www.vaers.hhs.gov, or by calling 1-800-822-7967.  VAERS is  only for reporting reactions. They do not give medical advice.   THE NATIONAL VACCINE INJURY COMPENSATION PROGRAM  The National Vaccine Injury Compensation Program (VICP) is a federal program that was created to compensate people who may have been injured by certain vaccines.  Persons who believe they may have been injured by a vaccine can learn about the program and about filing a claim by calling 1-800-338-2382 or visiting the VICP website at www.hrsa.gov/vaccinecompensation.  HOW CAN I LEARN MORE?  · Ask your doctor.  · Call your local or state health department.  · Contact the Centers for Disease Control and Prevention (CDC):  ¨ Call 1-800-232-4636 or visit CDC's website at www.cdc.gov/vaccines.  CDC Tdap Vaccine VIS (06/22/11)  Document Released: 08/01/2011 Document Revised: 05/27/2012 Document Reviewed: 05/22/2012  ExitCare®   Patient Information ©2015 [employer]. This information is not intended to replace advice given to you by your health care provider. Make sure you discuss any questions you have with your health care provider.

## 2013-09-09 ENCOUNTER — Encounter (HOSPITAL_COMMUNITY): Payer: Self-pay

## 2013-09-09 ENCOUNTER — Ambulatory Visit (HOSPITAL_COMMUNITY)
Admission: RE | Admit: 2013-09-09 | Discharge: 2013-09-09 | Disposition: A | Discharge status: Home / Self Care | Payer: No Typology Code available for payment source | Source: Ambulatory Visit | Attending: Advanced Practice Midwife | Admitting: Advanced Practice Midwife

## 2013-09-09 DIAGNOSIS — O09522 Supervision of elderly multigravida, second trimester: Secondary | ICD-10-CM

## 2013-09-09 DIAGNOSIS — Z3689 Encounter for other specified antenatal screening: Secondary | ICD-10-CM | POA: Insufficient documentation

## 2013-09-09 DIAGNOSIS — O09529 Supervision of elderly multigravida, unspecified trimester: Secondary | ICD-10-CM | POA: Insufficient documentation

## 2013-09-12 ENCOUNTER — Encounter: Payer: Self-pay | Admitting: Advanced Practice Midwife

## 2013-09-26 ENCOUNTER — Ambulatory Visit (INDEPENDENT_AMBULATORY_CARE_PROVIDER_SITE_OTHER): Payer: PRIVATE HEALTH INSURANCE | Admitting: Obstetrics and Gynecology

## 2013-09-26 ENCOUNTER — Encounter: Payer: Self-pay | Admitting: Obstetrics and Gynecology

## 2013-09-26 VITALS — BP 102/60 | HR 77 | Wt 169.0 lb

## 2013-09-26 DIAGNOSIS — Z3480 Encounter for supervision of other normal pregnancy, unspecified trimester: Secondary | ICD-10-CM

## 2013-09-26 DIAGNOSIS — Z3481 Encounter for supervision of other normal pregnancy, first trimester: Secondary | ICD-10-CM

## 2013-09-26 DIAGNOSIS — Z23 Encounter for immunization: Secondary | ICD-10-CM

## 2013-09-26 LAB — CBC
HCT: 33.6 % — ABNORMAL LOW (ref 36.0–46.0)
Hemoglobin: 12 g/dL (ref 12.0–15.0)
MCH: 33.2 pg (ref 26.0–34.0)
MCHC: 35.7 g/dL (ref 30.0–36.0)
MCV: 93.1 fL (ref 78.0–100.0)
Platelets: 199 K/uL (ref 150–400)
RBC: 3.61 MIL/uL — ABNORMAL LOW (ref 3.87–5.11)
RDW: 13.5 % (ref 11.5–15.5)
WBC: 8.8 K/uL (ref 4.0–10.5)

## 2013-09-26 NOTE — Addendum Note (Signed)
Addended by: Arne ClevelandHUTCHINSON, MANDY J on: 09/26/2013 09:37 AM   Modules accepted: Orders

## 2013-09-26 NOTE — Progress Notes (Signed)
28 wk labs today. Offered tdap> will do. Discussed birth plans, wants no epidural and thinking about waterbirth. Personal trainer and works out 3x/wk.

## 2013-09-26 NOTE — Patient Instructions (Signed)
Third Trimester of Pregnancy The third trimester is from week 29 through week 42, months 7 through 9. The third trimester is a time when the fetus is growing rapidly. At the end of the ninth month, the fetus is about 20 inches in length and weighs 6-10 pounds.  BODY CHANGES Your body goes through many changes during pregnancy. The changes vary from woman to woman.   Your weight will continue to increase. You can expect to gain 25-35 pounds (11-16 kg) by the end of the pregnancy.  You may begin to get stretch marks on your hips, abdomen, and breasts.  You may urinate more often because the fetus is moving lower into your pelvis and pressing on your bladder.  You may develop or continue to have heartburn as a result of your pregnancy.  You may develop constipation because certain hormones are causing the muscles that push waste through your intestines to slow down.  You may develop hemorrhoids or swollen, bulging veins (varicose veins).  You may have pelvic pain because of the weight gain and pregnancy hormones relaxing your joints between the bones in your pelvis. Backaches may result from overexertion of the muscles supporting your posture.  You may have changes in your hair. These can include thickening of your hair, rapid growth, and changes in texture. Some women also have hair loss during or after pregnancy, or hair that feels dry or thin. Your hair will most likely return to normal after your baby is born.  Your breasts will continue to grow and be tender. A yellow discharge may leak from your breasts called colostrum.  Your belly button may stick out.  You may feel short of breath because of your expanding uterus.  You may notice the fetus "dropping," or moving lower in your abdomen.  You may have a bloody mucus discharge. This usually occurs a few days to a week before labor begins.  Your cervix becomes thin and soft (effaced) near your due date. WHAT TO EXPECT AT YOUR PRENATAL  EXAMS  You will have prenatal exams every 2 weeks until week 36. Then, you will have weekly prenatal exams. During a routine prenatal visit:  You will be weighed to make sure you and the fetus are growing normally.  Your blood pressure is taken.  Your abdomen will be measured to track your baby's growth.  The fetal heartbeat will be listened to.  Any test results from the previous visit will be discussed.  You may have a cervical check near your due date to see if you have effaced. At around 36 weeks, your caregiver will check your cervix. At the same time, your caregiver will also perform a test on the secretions of the vaginal tissue. This test is to determine if a type of bacteria, Group B streptococcus, is present. Your caregiver will explain this further. Your caregiver may ask you:  What your birth plan is.  How you are feeling.  If you are feeling the baby move.  If you have had any abnormal symptoms, such as leaking fluid, bleeding, severe headaches, or abdominal cramping.  If you have any questions. Other tests or screenings that may be performed during your third trimester include:  Blood tests that check for low iron levels (anemia).  Fetal testing to check the health, activity level, and growth of the fetus. Testing is done if you have certain medical conditions or if there are problems during the pregnancy. FALSE LABOR You may feel small, irregular contractions that   eventually go away. These are called Braxton Hicks contractions, or false labor. Contractions may last for hours, days, or even weeks before true labor sets in. If contractions come at regular intervals, intensify, or become painful, it is best to be seen by your caregiver.  SIGNS OF LABOR   Menstrual-like cramps.  Contractions that are 5 minutes apart or less.  Contractions that start on the top of the uterus and spread down to the lower abdomen and back.  A sense of increased pelvic pressure or back  pain.  A watery or bloody mucus discharge that comes from the vagina. If you have any of these signs before the 37th week of pregnancy, call your caregiver right away. You need to go to the hospital to get checked immediately. HOME CARE INSTRUCTIONS   Avoid all smoking, herbs, alcohol, and unprescribed drugs. These chemicals affect the formation and growth of the baby.  Follow your caregiver's instructions regarding medicine use. There are medicines that are either safe or unsafe to take during pregnancy.  Exercise only as directed by your caregiver. Experiencing uterine cramps is a good sign to stop exercising.  Continue to eat regular, healthy meals.  Wear a good support bra for breast tenderness.  Do not use hot tubs, steam rooms, or saunas.  Wear your seat belt at all times when driving.  Avoid raw meat, uncooked cheese, cat litter boxes, and soil used by cats. These carry germs that can cause birth defects in the baby.  Take your prenatal vitamins.  Try taking a stool softener (if your caregiver approves) if you develop constipation. Eat more high-fiber foods, such as fresh vegetables or fruit and whole grains. Drink plenty of fluids to keep your urine clear or pale yellow.  Take warm sitz baths to soothe any pain or discomfort caused by hemorrhoids. Use hemorrhoid cream if your caregiver approves.  If you develop varicose veins, wear support hose. Elevate your feet for 15 minutes, 3-4 times a day. Limit salt in your diet.  Avoid heavy lifting, wear low heal shoes, and practice good posture.  Rest a lot with your legs elevated if you have leg cramps or low back pain.  Visit your dentist if you have not gone during your pregnancy. Use a soft toothbrush to brush your teeth and be gentle when you floss.  A sexual relationship may be continued unless your caregiver directs you otherwise.  Do not travel far distances unless it is absolutely necessary and only with the approval  of your caregiver.  Take prenatal classes to understand, practice, and ask questions about the labor and delivery.  Make a trial run to the hospital.  Pack your hospital bag.  Prepare the baby's nursery.  Continue to go to all your prenatal visits as directed by your caregiver. SEEK MEDICAL CARE IF:  You are unsure if you are in labor or if your water has broken.  You have dizziness.  You have mild pelvic cramps, pelvic pressure, or nagging pain in your abdominal area.  You have persistent nausea, vomiting, or diarrhea.  You have a bad smelling vaginal discharge.  You have pain with urination. SEEK IMMEDIATE MEDICAL CARE IF:   You have a fever.  You are leaking fluid from your vagina.  You have spotting or bleeding from your vagina.  You have severe abdominal cramping or pain.  You have rapid weight loss or gain.  You have shortness of breath with chest pain.  You notice sudden or extreme swelling   of your face, hands, ankles, feet, or legs.  You have not felt your baby move in over an hour.  You have severe headaches that do not go away with medicine.  You have vision changes. Document Released: 01/24/2001 Document Revised: 02/04/2013 Document Reviewed: 04/02/2012 Exit[employer] Patient Information 2015 [employer]. This information is not intended to replace advice given to you by your health care provider. Make sure you discuss any questions you have with your health care provider.  

## 2013-09-26 NOTE — Addendum Note (Signed)
Addended by: Arne ClevelandHUTCHINSON, Adonia Porada J on: 09/26/2013 09:35 AM   Modules accepted: Orders

## 2013-09-27 LAB — RPR

## 2013-09-27 LAB — HIV ANTIBODY (ROUTINE TESTING W REFLEX): HIV: NONREACTIVE

## 2013-09-27 LAB — GLUCOSE TOLERANCE, 1 HOUR (50G) W/O FASTING: GLUCOSE 1 HOUR GTT: 97 mg/dL (ref 70–140)

## 2013-09-29 ENCOUNTER — Telehealth: Payer: Self-pay | Admitting: *Deleted

## 2013-09-29 NOTE — Telephone Encounter (Signed)
Pt notified of normal 1 hr GTT. 

## 2013-10-10 ENCOUNTER — Ambulatory Visit (INDEPENDENT_AMBULATORY_CARE_PROVIDER_SITE_OTHER): Payer: PRIVATE HEALTH INSURANCE | Admitting: Advanced Practice Midwife

## 2013-10-10 VITALS — BP 114/64 | HR 75 | Wt 171.0 lb

## 2013-10-10 DIAGNOSIS — N898 Other specified noninflammatory disorders of vagina: Secondary | ICD-10-CM

## 2013-10-10 DIAGNOSIS — Z3480 Encounter for supervision of other normal pregnancy, unspecified trimester: Secondary | ICD-10-CM

## 2013-10-10 MED ORDER — FLUCONAZOLE 150 MG PO TABS: 150.0000 mg | ORAL_TABLET | Freq: Once | ORAL | Status: DC

## 2013-10-10 NOTE — Progress Notes (Signed)
Doing well.  Good fetal movement, denies vaginal bleeding, LOF, regular contractions.  Reports some vaginal wetness with odor.  SSE with thick white/yellow discharge.  Wet prep pending. Treat for yeast with Diflucan 150 mg x2 doses in 48 hours.

## 2013-10-10 NOTE — Progress Notes (Signed)
Feels like she stays wet all the time with an odor

## 2013-10-11 LAB — WET PREP FOR TRICH, YEAST, CLUE
Clue Cells Wet Prep HPF POC: NONE SEEN
Trich, Wet Prep: NONE SEEN
YEAST WET PREP: NONE SEEN

## 2013-10-24 ENCOUNTER — Ambulatory Visit (INDEPENDENT_AMBULATORY_CARE_PROVIDER_SITE_OTHER): Payer: No Typology Code available for payment source | Admitting: Family

## 2013-10-24 VITALS — BP 101/64 | HR 83 | Wt 173.0 lb

## 2013-10-24 DIAGNOSIS — Z3480 Encounter for supervision of other normal pregnancy, unspecified trimester: Secondary | ICD-10-CM

## 2013-10-24 DIAGNOSIS — Z3481 Encounter for supervision of other normal pregnancy, first trimester: Secondary | ICD-10-CM

## 2013-10-24 MED ORDER — DOXYLAMINE-PYRIDOXINE 10-10 MG PO TBEC: DELAYED_RELEASE_TABLET | ORAL | Status: DC

## 2013-10-24 MED ORDER — PROMETHAZINE HCL 12.5 MG PO TABS: 12.5000 mg | ORAL_TABLET | Freq: Four times a day (QID) | ORAL | Status: DC | PRN

## 2013-10-24 NOTE — Patient Instructions (Signed)
Diclegis:  Take two at bedtime, if no improvement add another one in the morning.  If still no improvement then add another one in the afternoon.

## 2013-10-24 NOTE — Progress Notes (Signed)
Pt has increased n/v and irregular contractions.

## 2013-10-24 NOTE — Progress Notes (Signed)
Pt feeling increased nausea, no vomiting for past week.  No recent illness.  Reviewed third trimester lab results.  RX Diclegis and phenergan for PM to aid with sleeping.

## 2013-11-10 ENCOUNTER — Ambulatory Visit (INDEPENDENT_AMBULATORY_CARE_PROVIDER_SITE_OTHER): Payer: PRIVATE HEALTH INSURANCE | Admitting: Advanced Practice Midwife

## 2013-11-10 ENCOUNTER — Encounter: Payer: Self-pay | Admitting: *Deleted

## 2013-11-10 VITALS — BP 97/59 | HR 81 | Wt 175.0 lb

## 2013-11-10 DIAGNOSIS — Z3481 Encounter for supervision of other normal pregnancy, first trimester: Secondary | ICD-10-CM

## 2013-11-10 DIAGNOSIS — Z3480 Encounter for supervision of other normal pregnancy, unspecified trimester: Secondary | ICD-10-CM

## 2013-11-10 DIAGNOSIS — J45909 Unspecified asthma, uncomplicated: Secondary | ICD-10-CM | POA: Insufficient documentation

## 2013-11-10 DIAGNOSIS — Z23 Encounter for immunization: Secondary | ICD-10-CM

## 2013-11-10 MED ORDER — INFLUENZA VAC SPLIT QUAD 0.5 ML IM SUSY
0.5000 mL | PREFILLED_SYRINGE | Freq: Once | INTRAMUSCULAR | Status: DC
Start: 2013-11-10 — End: 2014-02-15

## 2013-11-10 NOTE — Addendum Note (Signed)
Addended by: Granville Lewis on: 11/10/2013 11:54 AM   Modules accepted: Orders

## 2013-11-10 NOTE — Progress Notes (Signed)
Waterbirth class and consent done. Flu shot today.

## 2013-11-24 ENCOUNTER — Encounter (INDEPENDENT_AMBULATORY_CARE_PROVIDER_SITE_OTHER): Payer: Self-pay

## 2013-11-24 ENCOUNTER — Encounter: Payer: Self-pay | Admitting: *Deleted

## 2013-11-24 ENCOUNTER — Ambulatory Visit (INDEPENDENT_AMBULATORY_CARE_PROVIDER_SITE_OTHER): Payer: No Typology Code available for payment source | Admitting: Advanced Practice Midwife

## 2013-11-24 VITALS — BP 94/60 | HR 79 | Wt 174.0 lb

## 2013-11-24 DIAGNOSIS — Z3493 Encounter for supervision of normal pregnancy, unspecified, third trimester: Secondary | ICD-10-CM

## 2013-11-24 LAB — OB RESULTS CONSOLE GC/CHLAMYDIA
Chlamydia: NEGATIVE
GC PROBE AMP, GENITAL: NEGATIVE

## 2013-11-24 LAB — OB RESULTS CONSOLE GBS: STREP GROUP B AG: NEGATIVE

## 2013-11-24 NOTE — Progress Notes (Signed)
Doing well.  Good fetal movement, denies vaginal bleeding, LOF, regular contractions.  Some irregular cramping off and on last few days, goes away with rest/fluids.  GBS collected today.  Plans waterbirth, reviewed signs of labor/reasons to come to hospital.

## 2013-11-25 LAB — GC/CHLAMYDIA PROBE AMP
CT Probe RNA: NEGATIVE
GC Probe RNA: NEGATIVE

## 2013-11-26 LAB — CULTURE, BETA STREP (GROUP B ONLY)

## 2013-11-27 ENCOUNTER — Encounter: Payer: Self-pay | Admitting: Advanced Practice Midwife

## 2013-12-01 ENCOUNTER — Ambulatory Visit (INDEPENDENT_AMBULATORY_CARE_PROVIDER_SITE_OTHER): Payer: PRIVATE HEALTH INSURANCE | Admitting: Advanced Practice Midwife

## 2013-12-01 VITALS — BP 106/62 | HR 84 | Wt 175.0 lb

## 2013-12-01 DIAGNOSIS — Z3481 Encounter for supervision of other normal pregnancy, first trimester: Secondary | ICD-10-CM

## 2013-12-01 NOTE — Progress Notes (Signed)
Lost mucous plug this weekend

## 2013-12-01 NOTE — Progress Notes (Signed)
Increased pelvic pressure. 

## 2013-12-08 ENCOUNTER — Encounter: Payer: Self-pay | Admitting: Advanced Practice Midwife

## 2013-12-08 ENCOUNTER — Encounter (HOSPITAL_COMMUNITY): Payer: Self-pay | Admitting: *Deleted

## 2013-12-08 ENCOUNTER — Telehealth (HOSPITAL_COMMUNITY): Payer: Self-pay | Admitting: *Deleted

## 2013-12-08 ENCOUNTER — Ambulatory Visit (INDEPENDENT_AMBULATORY_CARE_PROVIDER_SITE_OTHER): Payer: No Typology Code available for payment source | Admitting: Advanced Practice Midwife

## 2013-12-08 VITALS — BP 114/68 | HR 82 | Wt 176.0 lb

## 2013-12-08 DIAGNOSIS — O328XX0 Maternal care for other malpresentation of fetus, not applicable or unspecified: Secondary | ICD-10-CM

## 2013-12-08 DIAGNOSIS — Z3483 Encounter for supervision of other normal pregnancy, third trimester: Secondary | ICD-10-CM

## 2013-12-08 DIAGNOSIS — O321XX1 Maternal care for breech presentation, fetus 1: Secondary | ICD-10-CM

## 2013-12-08 DIAGNOSIS — Z3A38 38 weeks gestation of pregnancy: Secondary | ICD-10-CM

## 2013-12-08 NOTE — Telephone Encounter (Signed)
Preadmission screen  

## 2013-12-08 NOTE — Patient Instructions (Addendum)
Vaginal Delivery °During delivery, your health care provider will help you give birth to your baby. During a vaginal delivery, you will work to push the baby out of your vagina. However, before you can push your baby out, a few things need to happen. The opening of your uterus (cervix) has to soften, thin out, and open up (dilate) all the way to 10 cm. Also, your baby has to move down from the uterus into your vagina.  °SIGNS OF LABOR  °Your health care provider will first need to make sure you are in labor. Signs of labor include:  °· Passing what is called the mucous plug before labor begins. This is a small amount of blood-stained mucus. °· Having regular, painful uterine contractions.   °· The time between contractions gets shorter.   °· The discomfort and pain gradually get more intense. °· Contraction pains get worse when walking and do not go away when resting.   °· Your cervix becomes thinner (effacement) and dilates. °BEFORE THE DELIVERY °Once you are in labor and admitted into the hospital or care center, your health care provider may do the following:  °· Perform a complete physical exam. °· Review any complications related to pregnancy or labor.  °· Check your blood pressure, pulse, temperature, and heart rate (vital signs).   °· Determine if, and when, the rupture of amniotic membranes occurred. °· Do a vaginal exam (using a sterile glove and lubricant) to determine:   °¨ The position (presentation) of the baby. Is the baby's head presenting first (vertex) in the birth canal (vagina), or are the feet or buttocks first (breech)?   °¨ The level (station) of the baby's head within the birth canal.   °¨ The effacement and dilatation of the cervix.   °· An electronic fetal monitor is usually placed on your abdomen when you first arrive. This is used to monitor your contractions and the baby's heart rate. °¨ When the monitor is on your abdomen (external fetal monitor), it can only pick up the frequency and  length of your contractions. It cannot tell the strength of your contractions. °¨ If it becomes necessary for your health care provider to know exactly how strong your contractions are or to see exactly what the baby's heart rate is doing, an internal monitor may be inserted into your vagina and uterus. Your health care provider will discuss the benefits and risks of using an internal monitor and obtain your permission before inserting the device. °¨ Continuous fetal monitoring may be needed if you have an epidural, are receiving certain medicines (such as oxytocin), or have pregnancy or labor complications. °· An IV access tube may be placed into a vein in your arm to deliver fluids and medicines if necessary. °THREE STAGES OF LABOR AND DELIVERY °Normal labor and delivery is divided into three stages. °First Stage °This stage starts when you begin to contract regularly and your cervix begins to efface and dilate. It ends when your cervix is completely open (fully dilated). The first stage is the longest stage of labor and can last from 3 hours to 15 hours.  °Several methods are available to help with labor pain. You and your health care provider will decide which option is best for you. Options include:  °· Opioid medicines. These are strong pain medicines that you can get through your IV tube or as a shot into your muscle. These medicines lessen pain but do not make it go away completely.  °· Epidural. A medicine is given through a thin tube that   is inserted in your back. The medicine numbs the lower part of your body and prevents any pain in that area. °· Paracervical pain medicine. This is an injection of an anesthetic on each side of your cervix.   °· You may request natural childbirth, which does not involve the use of pain medicines or an epidural during labor and delivery. Instead, you will use other things, such as breathing exercises, to help cope with the pain. °Second Stage °The second stage of labor  begins when your cervix is fully dilated at 10 cm. It continues until you push your baby down through the birth canal and the baby is born. This stage can take only minutes or several hours. °· The location of your baby's head as it moves through the birth canal is reported as a number called a station. If the baby's head has not started its descent, the station is described as being at minus 3 (-3). When your baby's head is at the zero station, it is at the middle of the birth canal and is engaged in the pelvis. The station of your baby helps indicate the progress of the second stage of labor. °· When your baby is born, your health care provider may hold the baby with his or her head lowered to prevent amniotic fluid, mucus, and blood from getting into the baby's lungs. The baby's mouth and nose may be suctioned with a small bulb syringe to remove any additional fluid. °· Your health care provider may then place the baby on your stomach. It is important to keep the baby from getting cold. To do this, the health care provider will dry the baby off, place the baby directly on your skin (with no blankets between you and the baby), and cover the baby with warm, dry blankets.   °· The umbilical cord is cut. °Third Stage °During the third stage of labor, your health care provider will deliver the placenta (afterbirth) and make sure your bleeding is under control. The delivery of the placenta usually takes about 5 minutes but can take up to 30 minutes. After the placenta is delivered, a medicine may be given either by IV or injection to help contract the uterus and control bleeding. If you are planning to breastfeed, you can try to do so now. °After you deliver the placenta, your uterus should contract and get very firm. If your uterus does not remain firm, your health care provider will massage it. This is important because the contraction of the uterus helps cut off bleeding at the site where the placenta was attached  to your uterus. If your uterus does not contract properly and stay firm, you may continue to bleed heavily. If there is a lot of bleeding, medicines may be given to contract the uterus and stop the bleeding.  °Document Released: 11/09/2007 Document Revised: 06/16/2013 Document Reviewed: 07/21/2012 °[employer]® Patient Information ©2015 [employer]. This information is not intended to replace advice given to you by your health care provider. Make sure you discuss any questions you have with your health care provider. °External Cephalic Version °External cephalic version is turning a baby that is presenting his or her buttocks first (breech) or is lying sideways in the uterus (transverse) to a head-first position. This makes the labor and delivery faster, safer for the mother and baby, and lessens the chance for a cesarean section. It should not be tried until the pregnancy is [redacted] weeks along or longer. °BEFORE THE PROCEDURE  °· Do not   take aspirin. °· Do not eat for 4 hours before the procedure. °· Tell your caregiver if you have a cold, fever, or an infection. °· Tell your caregiver if you are having contractions. °· Tell your caregiver if you are leaking or had a gush of fluid from your vagina. °· Tell your caregiver if you have any vaginal bleeding or abnormal discharge. °· If you are being admitted the same day, arrive at the hospital at least one hour before the procedure to sign any necessary documents and to get prepared for the procedure. °· Tell your caregiver if you had any problems with anesthetics in the past. °· Tell your caregiver if you are taking any medications that your caregiver does not know about. This includes over-the-counter and prescription drugs, herbs, eye drops and creams. °PROCEDURE °· First, an ultrasound is done to make sure the baby is breech or transverse. °· A non-stress test or biophysical profile is done on the baby before the ECV. This is done to make sure it is safe for the baby  to have the ECV. It may also be done after the procedure to make sure the baby is okay. °· ECV is done in the delivery/surgical room with an anesthesiologist present. There should be a setup for an emergency cesarean section with a full nursing and nursery staff available and ready. °· The patient may be given a medication to relax the uterine muscles. An epidural may be given for any discomfort. It is helpful for the success of the ECV. °· An electronic fetal monitor is placed on the uterus during the procedure to make sure the baby is okay. °· If the mother is Rh-negative, Rho (D) immune globulin will be given to her to prevent Rh problems for future pregnancies. °· The mother is followed closely for 2 to 3 hours after the procedure to make sure no problems develop. °BENEFITS OF ECV °· Easier and safer labor and delivery for the mother and baby. °· Lower incidence of cesarean section. °· Lower costs with a vaginal delivery. °RISKS OF ECV °· The placenta pulls away from the wall of the uterus before delivery (abruption of the placenta). °· Rupture of the uterus, especially in patients with a previous cesarean section. °· Fetal distress. °· Early (premature) labor. °· Premature rupture of the membranes. °· The baby will return to the breech or transverse lie position. °· Death of the fetus can happen but is very rare. °ECV SHOULD BE STOPPED IF: °· The fetal heart tones drop. °· The mother is having a lot of pain. °· You cannot turn the baby after several attempts. °ECV SHOULD NOT BE DONE IF: °· The non-stress test or biophysical profile is abnormal. °· There is vaginal bleeding. °· An abnormal shaped uterus is present. °· There is heart disease or uncontrolled high blood pressure in the mother. °· There are twins or more. °· The placenta covers the opening of the cervix (placenta previa). °· You had a previous cesarean section with a classical incision or major surgery of the uterus. °· There is not enough amniotic  fluid in the sac (oligohydramnios). °· The baby is too small for the pregnancy or has not developed normally (anomaly). °· Your membranes have ruptured. °HOME CARE INSTRUCTIONS  °· Have someone take you home after the procedure. °· Rest at home for several hours. °· Have someone stay with you for a few hours after you get home. °· After ECV, continue with your prenatal visits as   directed. °· Continue your regular diet, rest and activities. °· Do not do any strenuous activities for a couple of days. °SEEK IMMEDIATE MEDICAL CARE IF:  °· You develop vaginal bleeding. °· You have fluid coming out of your vagina (bag of water may have broken). °· You develop uterine contractions. °· You do not feel the baby move or there is less movement of the baby. °· You develop abdominal pain. °· You develop an oral temperature of 102° F (38.9° C) or higher. °Document Released: 07/25/2006 Document Revised: 06/16/2013 Document Reviewed: 05/20/2008 °ExitCare® Patient Information ©2015 [employer]. This information is not intended to replace advice given to you by your health care provider. Make sure you discuss any questions you have with your health care provider. ° °

## 2013-12-08 NOTE — Progress Notes (Signed)
C/O dizziness for 3 days and very thirsty cannot quench thirst

## 2013-12-08 NOTE — Progress Notes (Signed)
Feeling congested. Will try Sudafed. Cannot feel presenting part, will US to verify.  Confirmed breech by US. Will schedule version with Dr Debroah LoopArnold on Wed 12/10/13 at 0700  Risks and benefits reviewed.

## 2013-12-10 ENCOUNTER — Encounter (HOSPITAL_COMMUNITY): Payer: Self-pay

## 2013-12-10 ENCOUNTER — Observation Stay (HOSPITAL_COMMUNITY)
Admission: RE | Admit: 2013-12-10 | Discharge: 2013-12-10 | Disposition: A | Discharge status: Home / Self Care | Payer: No Typology Code available for payment source | Source: Ambulatory Visit | Attending: Obstetrics & Gynecology | Admitting: Obstetrics & Gynecology

## 2013-12-10 DIAGNOSIS — O321XX0 Maternal care for breech presentation, not applicable or unspecified: Secondary | ICD-10-CM | POA: Diagnosis not present

## 2013-12-10 DIAGNOSIS — E039 Hypothyroidism, unspecified: Secondary | ICD-10-CM | POA: Diagnosis not present

## 2013-12-10 DIAGNOSIS — Z3A36 36 weeks gestation of pregnancy: Secondary | ICD-10-CM | POA: Insufficient documentation

## 2013-12-10 DIAGNOSIS — Z3A38 38 weeks gestation of pregnancy: Secondary | ICD-10-CM

## 2013-12-10 MED ORDER — LACTATED RINGERS IV SOLN: INTRAVENOUS | Status: DC

## 2013-12-10 MED ORDER — TERBUTALINE SULFATE 1 MG/ML IJ SOLN
0.2500 mg | Freq: Once | INTRAMUSCULAR | Status: AC
  Administered 2013-12-10: 0.25 mg via SUBCUTANEOUS

## 2013-12-10 MED ORDER — TERBUTALINE SULFATE 1 MG/ML IJ SOLN
0.2500 mg | Freq: Once | INTRAMUSCULAR | Status: DC
Start: 2013-12-10 — End: 2013-12-10
  Filled 2013-12-10: qty 1

## 2013-12-10 NOTE — OB Triage Note (Signed)
Pt verbalizes understanding 

## 2013-12-10 NOTE — Discharge Instructions (Signed)

## 2013-12-10 NOTE — Discharge Summary (Signed)
Physician Discharge Summary  Patient ID: Minna Antislisabeth Morea MRN: [id number] DOB/AGE: 36/03/1975 36 y.o.  Admit date: 12/10/2013 Discharge date: 12/10/2013  Admission Diagnoses:3340w4d Breech  Discharge Diagnoses: successful cephalic version Active Problems:   Breech presentation   Discharged Condition: good  Hospital Course: Minna Antislisabeth Chaudhari is a 38 y.o. G3P2002 at 7740w4d by LMP admitted for ECV  Subjective: good fetal movement little discomfort  Objective:  BP 112/66  Pulse 99  Resp 16  Ht 5\' 5"  (1.651 m)  Wt 176 lb (79.833 kg)  BMI 29.29 kg/m2  LMP 03/15/2013  FHT: FHR: 145 bpm, variability: marked, accelerations: Present, decelerations: Absent  UC: rare  SVE: deferred  Labs:  Lab Results   Component  Value  Date    WBC  8.8  09/26/2013    HGB  12.0  09/26/2013    HCT  33.6*  09/26/2013    MCV  93.1  09/26/2013    PLT  199  09/26/2013   ECV under US guidance with no difficulty,  Assessment / Plan:  not in labor, will discharge after adequate monitoring  Coco Sharpnack  12/10/2013, 9:37 AM   Consults: None  Significant Diagnostic Studies:    Treatments: ECV  Discharge Exam: Blood pressure 112/66, pulse 99, resp. rate 16, height 5\' 5"  (1.651 m), weight 176 lb (79.833 kg), last menstrual period 03/15/2013. General appearance: alert, cooperative and no distress GI: soft, non-tender; bowel sounds normal; no masses,  no organomegaly  Disposition: 01-Home or Self Care     Medication List         acetaminophen 325 MG tablet  Commonly known as:  TYLENOL  Take 650 mg by mouth once.     FISH OIL PO  Take 1 tablet by mouth daily.     PRENATAL VITAMINS PO  Take by mouth daily.           Follow-up Information   [facility] [place] In 5 days.   Specialty:  Obstetrics and Gynecology   Contact information:   [address], [address] [place] [address] [phone]      Signed: Scheryl DarterRNOLD,Dariel Betzer 12/10/2013, 4:36  PM

## 2013-12-10 NOTE — H&P (Signed)
Gabrielle Murphy is a 38 y.o. female presenting for ECV with breech presentation. Maternal Medical History:  Reason for admission: Breech presentation  Contractions: Frequency: rare.   Perceived severity is mild.    Fetal activity: Perceived fetal activity is normal.   Last perceived fetal movement was within the past hour.      OB History   Grav Para Term Preterm Abortions TAB SAB Ect Mult Living   3 2 2       2      Past Medical History  Diagnosis Date  . Hypothyroid     DX thru integrated Dr   Past Surgical History  Procedure Laterality Date  . Cholecystectomy    . Wisdom tooth extraction     Family History: family history includes Cancer in her maternal grandmother, maternal grandmother, and paternal grandfather; Heart disease in her maternal grandmother; Spina bifida in her sister. Social History:  reports that she has never smoked. She has never used smokeless tobacco. She reports that she does not drink alcohol or use illicit drugs.   Prenatal Transfer Tool  Maternal Diabetes: No Genetic Screening: Normal Maternal Ultrasounds/Referrals: Normal Fetal Ultrasounds or other Referrals:  None Maternal Substance Abuse:  No Significant Maternal Medications:  None Significant Maternal Lab Results:  None Other Comments:  None  Review of Systems  Constitutional: Negative.   Musculoskeletal: Negative.   Psychiatric/Behavioral: Negative.       Blood pressure 120/63, pulse 83, height 5\' 5"  (1.651 m), weight 176 lb (79.833 kg), last menstrual period 03/15/2013. Maternal Exam:  Uterine Assessment: Contraction strength is mild.  Contraction frequency is rare.   Abdomen: Patient reports no abdominal tenderness. Fundal height is c/w dates.   Estimated fetal weight is 7-8 lb.   Fetal presentation: breech  Introitus: not evaluated.    US confirms breech   Physical Exam  Constitutional: She is oriented to person, place, and time. No distress.  GI:  gravid   Musculoskeletal: Normal range of motion.  Neurological: She is alert and oriented to person, place, and time.  Skin: Skin is warm and dry.  Psychiatric: She has a normal mood and affect. Her behavior is normal.    Prenatal labs: ABO, Rh: O/POS/-- (03/27 0953) Antibody: NEG (03/27 0953) Rubella: 1.25 (03/27 0953) RPR: NON REAC (08/14 1030)  HBsAg: NEGATIVE (03/27 0953)  HIV: NONREACTIVE (08/14 1030)  GBS: Negative (10/12 0000)   Assessment/Plan: For ECV US guidance, procedure and risk of failure, pain , ROM, labor, cesarean delivery discussed and questions answered   [name] 12/10/2013, 8:48 AM

## 2013-12-10 NOTE — Progress Notes (Signed)
Gabrielle Murphy is a 38 y.o. G3P2002 at 8038w4d by LMP admitted for ECV  Subjective: good fetal movement little discomfort   Objective: BP 112/66  Pulse 99  Resp 16  Ht 5\' 5"  (1.651 m)  Wt 176 lb (79.833 kg)  BMI 29.29 kg/m2  LMP 03/15/2013      FHT:  FHR: 145 bpm, variability: marked,  accelerations:  Present,  decelerations:  Absent UC:   rare SVE:    deferred  Labs: Lab Results  Component Value Date   WBC 8.8 09/26/2013   HGB 12.0 09/26/2013   HCT 33.6* 09/26/2013   MCV 93.1 09/26/2013   PLT 199 09/26/2013  ECV under US guidance with no difficulty,   Assessment / Plan: not in labor, will discharge after adequate monitoring  ARNOLD,JAMES 12/10/2013, 9:37 AM

## 2013-12-15 ENCOUNTER — Ambulatory Visit (INDEPENDENT_AMBULATORY_CARE_PROVIDER_SITE_OTHER): Payer: No Typology Code available for payment source | Admitting: Advanced Practice Midwife

## 2013-12-15 ENCOUNTER — Encounter (HOSPITAL_COMMUNITY): Payer: Self-pay

## 2013-12-15 VITALS — BP 102/68 | HR 89 | Temp 97.8°F | Wt 176.0 lb

## 2013-12-15 DIAGNOSIS — Z3483 Encounter for supervision of other normal pregnancy, third trimester: Secondary | ICD-10-CM

## 2013-12-15 DIAGNOSIS — O321XX1 Maternal care for breech presentation, fetus 1: Secondary | ICD-10-CM

## 2013-12-15 NOTE — Progress Notes (Signed)
Version went well.  Membranes swept per pt request. Labor precautions

## 2013-12-15 NOTE — Patient Instructions (Signed)

## 2013-12-15 NOTE — Progress Notes (Signed)
Pt afraid baby has moved again

## 2013-12-22 ENCOUNTER — Ambulatory Visit (INDEPENDENT_AMBULATORY_CARE_PROVIDER_SITE_OTHER): Payer: PRIVATE HEALTH INSURANCE | Admitting: Advanced Practice Midwife

## 2013-12-22 VITALS — BP 114/68 | HR 92 | Wt 177.0 lb

## 2013-12-22 DIAGNOSIS — Z3493 Encounter for supervision of normal pregnancy, unspecified, third trimester: Secondary | ICD-10-CM

## 2013-12-22 NOTE — Patient Instructions (Signed)

## 2013-12-22 NOTE — Progress Notes (Signed)
Had several episodes of leaking. SSE >> no pooling, milky white discharge, negative Nitrazine, negative pooling. Wants to try to avoid IOL but her sister had a stillbirth, so is not going to risk it. Is willing to IOL at 41.5 wks. I will bring her back Friday for NST then plan IOL Tues or Wed.

## 2013-12-26 ENCOUNTER — Ambulatory Visit (INDEPENDENT_AMBULATORY_CARE_PROVIDER_SITE_OTHER): Payer: PRIVATE HEALTH INSURANCE | Admitting: Advanced Practice Midwife

## 2013-12-26 VITALS — BP 108/59 | HR 86 | Wt 175.0 lb

## 2013-12-26 DIAGNOSIS — Z3A40 40 weeks gestation of pregnancy: Secondary | ICD-10-CM

## 2013-12-26 DIAGNOSIS — Z3481 Encounter for supervision of other normal pregnancy, first trimester: Secondary | ICD-10-CM

## 2013-12-26 NOTE — Patient Instructions (Signed)
You are scheduled for Induction of Labor tomorrow 12/27/13 at 7:30 am. Please arrive at [facility] at 7:15 am. You may eat a light breakfast before coming to the hospital.   Labor Induction  Labor induction is when steps are taken to cause a pregnant woman to begin the labor process. Most women go into labor on their own between 37 weeks and 42 weeks of the pregnancy. When this does not happen or when there is a medical need, methods may be used to induce labor. Labor induction causes a pregnant woman's uterus to contract. It also causes the cervix to soften (ripen), open (dilate), and thin out (efface). Usually, labor is not induced before 39 weeks of the pregnancy unless there is a problem with the baby or mother.  Before inducing labor, your health care provider will consider a number of factors, including the following:  The medical condition of you and the baby.   How many weeks along you are.   The status of the baby's lung maturity.   The condition of the cervix.   The position of the baby.  WHAT ARE THE REASONS FOR LABOR INDUCTION? Labor may be induced for the following reasons: 1. The health of the baby or mother is at risk.  2. The pregnancy is overdue by 1 week or more.  3. The water breaks but labor does not start on its own.  4. The mother has a health condition or serious illness, such as high blood pressure, infection, placental abruption, or diabetes. 5. The amniotic fluid amounts are low around the baby.  6. The baby is distressed.  Convenience or wanting the baby to be born on a certain date is not a reason for inducing labor. WHAT METHODS ARE USED FOR LABOR INDUCTION? Several methods of labor induction may be used, such as:   Prostaglandin medicine. This medicine causes the cervix to dilate and ripen. The medicine will also start contractions. It can be taken by mouth or by inserting a suppository into the vagina.   Inserting a thin tube (catheter)  with a balloon on the end into the vagina to dilate the cervix. Once inserted, the balloon is expanded with water, which causes the cervix to open.   Stripping the membranes. Your health care provider separates amniotic sac tissue from the cervix, causing the cervix to be stretched and causing the release of a hormone called progesterone. This may cause the uterus to contract. It is often done during an office visit. You will be sent home to wait for the contractions to begin. You will then come in for an induction.   Breaking the water. Your health care provider makes a hole in the amniotic sac using a small instrument. Once the amniotic sac breaks, contractions should begin. This may still take hours to see an effect.   Medicine to trigger or strengthen contractions. This medicine is given through an IV access tube inserted into a vein in your arm.  All of the methods of induction, besides stripping the membranes, will be done in the hospital. Induction is done in the hospital so that you and the baby can be carefully monitored.  HOW LONG DOES IT TAKE FOR LABOR TO BE INDUCED? Some inductions can take up to 2-3 days. Depending on the cervix, it usually takes less time. It takes longer when you are induced early in the pregnancy or if this is your first pregnancy. If a mother is still pregnant and the induction has been  going on for 2-3 days, either the mother will be sent home or a cesarean delivery will be needed. WHAT ARE THE RISKS ASSOCIATED WITH LABOR INDUCTION? Some of the risks of induction include:   Changes in fetal heart rate, such as too high, too low, or erratic.   Fetal distress.   Chance of infection for the mother and baby.   Increased chance of having a cesarean delivery.   Breaking off (abruption) of the placenta from the uterus (rare).   Uterine rupture (very rare).  When induction is needed for medical reasons, the benefits of induction may outweigh the  risks. WHAT ARE SOME REASONS FOR NOT INDUCING LABOR? Labor induction should not be done if:   It is shown that your baby does not tolerate labor.   You have had previous surgeries on your uterus, such as a myomectomy or the removal of fibroids.   Your placenta lies very low in the uterus and blocks the opening of the cervix (placenta previa).   Your baby is not in a head-down position.   The umbilical cord drops down into the birth canal in front of the baby. This could cut off the baby's blood and oxygen supply.   You have had a previous cesarean delivery.   There are unusual circumstances, such as the baby being extremely premature.  Document Released: 06/21/2006 Document Revised: 10/02/2012 Document Reviewed: 08/29/2012 [facility] Patient Information 2015 [place]. This information is not intended to replace advice given to you by your health care provider. Make sure you discuss any questions you have with your health care provider.  Fetal Movement Counts Patient Name: __________________________________________________ Patient Due Date: ____________________ Performing a fetal movement count is highly recommended in high-risk pregnancies, but it is good for every pregnant woman to do. Your health care provider may ask you to start counting fetal movements at 28 weeks of the pregnancy. Fetal movements often increase:  After eating a full meal.  After physical activity.  After eating or drinking something sweet or cold.  At rest. Pay attention to when you feel the baby is most active. This will help you notice a pattern of your baby's sleep and wake cycles and what factors contribute to an increase in fetal movement. It is important to perform a fetal movement count at the same time each day when your baby is normally most active.  HOW TO COUNT FETAL MOVEMENTS 7. Find a quiet and comfortable area to sit or lie down on your left side. Lying on your left side provides the  best blood and oxygen circulation to your baby. 8. Write down the day and time on a sheet of paper or in a journal. 9. Start counting kicks, flutters, swishes, rolls, or jabs in a 2-hour period. You should feel at least 10 movements within 2 hours. 10. If you do not feel 10 movements in 2 hours, wait 2-3 hours and count again. Look for a change in the pattern or not enough counts in 2 hours. SEEK MEDICAL CARE IF:  You feel less than 10 counts in 2 hours, tried twice.  There is no movement in over an hour.  The pattern is changing or taking longer each day to reach 10 counts in 2 hours.  You feel the baby is not moving as he or she usually does. Date: ____________ Movements: ____________ Start time: ____________ Doreatha MartinFinish time: ____________  Date: ____________ Movements: ____________ Start time: ____________ Doreatha MartinFinish time: ____________ Date: ____________ Movements: ____________ Start time: ____________  Finish time: ____________ Date: ____________ Movements: ____________ Start time: ____________ Doreatha MartinFinish time: ____________ Date: ____________ Movements: ____________ Start time: ____________ Doreatha MartinFinish time: ____________ Date: ____________ Movements: ____________ Start time: ____________ Doreatha MartinFinish time: ____________ Date: ____________ Movements: ____________ Start time: ____________ Doreatha MartinFinish time: ____________ Date: ____________ Movements: ____________ Start time: ____________ Doreatha MartinFinish time: ____________  Date: ____________ Movements: ____________ Start time: ____________ Doreatha MartinFinish time: ____________ Date: ____________ Movements: ____________ Start time: ____________ Doreatha MartinFinish time: ____________ Date: ____________ Movements: ____________ Start time: ____________ Doreatha MartinFinish time: ____________ Date: ____________ Movements: ____________ Start time: ____________ Doreatha MartinFinish time: ____________ Date: ____________ Movements: ____________ Start time: ____________ Doreatha MartinFinish time: ____________ Date: ____________ Movements: ____________  Start time: ____________ Doreatha MartinFinish time: ____________ Date: ____________ Movements: ____________ Start time: ____________ Doreatha MartinFinish time: ____________  Date: ____________ Movements: ____________ Start time: ____________ Doreatha MartinFinish time: ____________ Date: ____________ Movements: ____________ Start time: ____________ Doreatha MartinFinish time: ____________ Date: ____________ Movements: ____________ Start time: ____________ Doreatha MartinFinish time: ____________ Date: ____________ Movements: ____________ Start time: ____________ Doreatha MartinFinish time: ____________ Date: ____________ Movements: ____________ Start time: ____________ Doreatha MartinFinish time: ____________ Date: ____________ Movements: ____________ Start time: ____________ Doreatha MartinFinish time: ____________ Date: ____________ Movements: ____________ Start time: ____________ Doreatha MartinFinish time: ____________  Date: ____________ Movements: ____________ Start time: ____________ Doreatha MartinFinish time: ____________ Date: ____________ Movements: ____________ Start time: ____________ Doreatha MartinFinish time: ____________ Date: ____________ Movements: ____________ Start time: ____________ Doreatha MartinFinish time: ____________ Date: ____________ Movements: ____________ Start time: ____________ Doreatha MartinFinish time: ____________ Date: ____________ Movements: ____________ Start time: ____________ Doreatha MartinFinish time: ____________ Date: ____________ Movements: ____________ Start time: ____________ Doreatha MartinFinish time: ____________ Date: ____________ Movements: ____________ Start time: ____________ Doreatha MartinFinish time: ____________  Date: ____________ Movements: ____________ Start time: ____________ Doreatha MartinFinish time: ____________ Date: ____________ Movements: ____________ Start time: ____________ Doreatha MartinFinish time: ____________ Date: ____________ Movements: ____________ Start time: ____________ Doreatha MartinFinish time: ____________ Date: ____________ Movements: ____________ Start time: ____________ Doreatha MartinFinish time: ____________ Date: ____________ Movements: ____________ Start time: ____________ Doreatha MartinFinish time:  ____________ Date: ____________ Movements: ____________ Start time: ____________ Doreatha MartinFinish time: ____________ Date: ____________ Movements: ____________ Start time: ____________ Doreatha MartinFinish time: ____________  Date: ____________ Movements: ____________ Start time: ____________ Doreatha MartinFinish time: ____________ Date: ____________ Movements: ____________ Start time: ____________ Doreatha MartinFinish time: ____________ Date: ____________ Movements: ____________ Start time: ____________ Doreatha MartinFinish time: ____________ Date: ____________ Movements: ____________ Start time: ____________ Doreatha MartinFinish time: ____________ Date: ____________ Movements: ____________ Start time: ____________ Doreatha MartinFinish time: ____________ Date: ____________ Movements: ____________ Start time: ____________ Doreatha MartinFinish time: ____________ Date: ____________ Movements: ____________ Start time: ____________ Doreatha MartinFinish time: ____________  Date: ____________ Movements: ____________ Start time: ____________ Doreatha MartinFinish time: ____________ Date: ____________ Movements: ____________ Start time: ____________ Doreatha MartinFinish time: ____________ Date: ____________ Movements: ____________ Start time: ____________ Doreatha MartinFinish time: ____________ Date: ____________ Movements: ____________ Start time: ____________ Doreatha MartinFinish time: ____________ Date: ____________ Movements: ____________ Start time: ____________ Doreatha MartinFinish time: ____________ Date: ____________ Movements: ____________ Start time: ____________ Doreatha MartinFinish time: ____________ Date: ____________ Movements: ____________ Start time: ____________ Doreatha MartinFinish time: ____________  Date: ____________ Movements: ____________ Start time: ____________ Doreatha MartinFinish time: ____________ Date: ____________ Movements: ____________ Start time: ____________ Doreatha MartinFinish time: ____________ Date: ____________ Movements: ____________ Start time: ____________ Doreatha MartinFinish time: ____________ Date: ____________ Movements: ____________ Start time: ____________ Doreatha MartinFinish time: ____________ Date: ____________ Movements:  ____________ Start time: ____________ Doreatha MartinFinish time: ____________ Date: ____________ Movements: ____________ Start time: ____________ Doreatha MartinFinish time: ____________ Document Released: 03/01/2006 Document Revised: 06/16/2013 Document Reviewed: 11/27/2011 [employer] Patient Information 2015 [place], [place]. This information is not intended to replace advice given to you by your health care provider. Make sure you discuss any questions you have with your health care provider.

## 2013-12-26 NOTE — Progress Notes (Signed)
Pt states she has severe groin pain - Pt also c/o nausea returning. Pt wants to be induced ASAP because her sister had a stillborn after due date.

## 2013-12-26 NOTE — Progress Notes (Signed)
Extremely uncomfortable, nauseous, requesting IOL. Very worried about SIL having stillbirth. Will send for IOL in am per consult w/ Dr. Jolayne Pantheronstant.

## 2013-12-27 ENCOUNTER — Encounter (HOSPITAL_COMMUNITY): Payer: Self-pay | Admitting: *Deleted

## 2013-12-27 ENCOUNTER — Inpatient Hospital Stay (HOSPITAL_COMMUNITY)
Admission: RE | Admit: 2013-12-27 | Discharge: 2013-12-29 | DRG: 775 | Disposition: A | Discharge status: Home / Self Care | Payer: No Typology Code available for payment source | Source: Ambulatory Visit | Attending: Obstetrics & Gynecology | Admitting: Obstetrics & Gynecology

## 2013-12-27 ENCOUNTER — Inpatient Hospital Stay (HOSPITAL_COMMUNITY): Payer: No Typology Code available for payment source | Admitting: Anesthesiology

## 2013-12-27 DIAGNOSIS — O09523 Supervision of elderly multigravida, third trimester: Secondary | ICD-10-CM | POA: Diagnosis not present

## 2013-12-27 DIAGNOSIS — Z3A41 41 weeks gestation of pregnancy: Secondary | ICD-10-CM | POA: Diagnosis present

## 2013-12-27 DIAGNOSIS — Z8249 Family history of ischemic heart disease and other diseases of the circulatory system: Secondary | ICD-10-CM

## 2013-12-27 DIAGNOSIS — O321XX0 Maternal care for breech presentation, not applicable or unspecified: Secondary | ICD-10-CM | POA: Diagnosis present

## 2013-12-27 DIAGNOSIS — O480 Post-term pregnancy: Principal | ICD-10-CM | POA: Diagnosis present

## 2013-12-27 DIAGNOSIS — Z3481 Encounter for supervision of other normal pregnancy, first trimester: Secondary | ICD-10-CM

## 2013-12-27 DIAGNOSIS — Z3490 Encounter for supervision of normal pregnancy, unspecified, unspecified trimester: Secondary | ICD-10-CM

## 2013-12-27 LAB — CBC
HCT: 37.6 % (ref 36.0–46.0)
HEMOGLOBIN: 13.4 g/dL (ref 12.0–15.0)
MCH: 34.1 pg — AB (ref 26.0–34.0)
MCHC: 35.6 g/dL (ref 30.0–36.0)
MCV: 95.7 fL (ref 78.0–100.0)
PLATELETS: 166 10*3/uL (ref 150–400)
RBC: 3.93 MIL/uL (ref 3.87–5.11)
RDW: 12.8 % (ref 11.5–15.5)
WBC: 10.3 10*3/uL (ref 4.0–10.5)

## 2013-12-27 LAB — RPR

## 2013-12-27 LAB — ABO/RH: ABO/RH(D): O POS

## 2013-12-27 LAB — TYPE AND SCREEN
ABO/RH(D): O POS
Antibody Screen: NEGATIVE

## 2013-12-27 MED ORDER — FLEET ENEMA 7-19 GM/118ML RE ENEM: 1.0000 | ENEMA | Freq: Every day | RECTAL | Status: DC | PRN

## 2013-12-27 MED ORDER — OXYTOCIN BOLUS FROM INFUSION: 500.0000 mL | INTRAVENOUS | Status: DC

## 2013-12-27 MED ORDER — FENTANYL CITRATE 0.05 MG/ML IJ SOLN
100.0000 ug | INTRAMUSCULAR | Status: DC | PRN
  Administered 2013-12-27: 100 ug via INTRAVENOUS
  Filled 2013-12-27: qty 2

## 2013-12-27 MED ORDER — LACTATED RINGERS IV SOLN
500.0000 mL | Freq: Once | INTRAVENOUS | Status: AC
  Administered 2013-12-27: 500 mL via INTRAVENOUS

## 2013-12-27 MED ORDER — FENTANYL 2.5 MCG/ML BUPIVACAINE 1/10 % EPIDURAL INFUSION (WH - ANES)
INTRAMUSCULAR | Status: DC | PRN
  Administered 2013-12-27: 14 mL/h via EPIDURAL

## 2013-12-27 MED ORDER — BISACODYL 10 MG RE SUPP: 10.0000 mg | Freq: Every day | RECTAL | Status: DC | PRN

## 2013-12-27 MED ORDER — ONDANSETRON HCL 4 MG PO TABS
4.0000 mg | ORAL_TABLET | ORAL | Status: DC | PRN
Start: 2013-12-27 — End: 2013-12-29

## 2013-12-27 MED ORDER — MISOPROSTOL 200 MCG PO TABS
50.0000 ug | ORAL_TABLET | ORAL | Status: DC | PRN
  Administered 2013-12-27: 50 ug via ORAL
  Filled 2013-12-27: qty 0.5

## 2013-12-27 MED ORDER — CITRIC ACID-SODIUM CITRATE 334-500 MG/5ML PO SOLN
30.0000 mL | ORAL | Status: DC | PRN
Start: 2013-12-27 — End: 2013-12-27

## 2013-12-27 MED ORDER — TERBUTALINE SULFATE 1 MG/ML IJ SOLN: 0.2500 mg | Freq: Once | INTRAMUSCULAR | Status: DC | PRN

## 2013-12-27 MED ORDER — ONDANSETRON HCL 4 MG/2ML IJ SOLN: 4.0000 mg | Freq: Four times a day (QID) | INTRAMUSCULAR | Status: DC | PRN

## 2013-12-27 MED ORDER — DIPHENHYDRAMINE HCL 25 MG PO CAPS: 25.0000 mg | ORAL_CAPSULE | Freq: Four times a day (QID) | ORAL | Status: DC | PRN

## 2013-12-27 MED ORDER — OXYTOCIN 40 UNITS IN LACTATED RINGERS INFUSION - SIMPLE MED
62.5000 mL/h | INTRAVENOUS | Status: DC
  Filled 2013-12-27: qty 1000

## 2013-12-27 MED ORDER — EPHEDRINE 5 MG/ML INJ
10.0000 mg | INTRAVENOUS | Status: DC | PRN
  Filled 2013-12-27: qty 2

## 2013-12-27 MED ORDER — OXYCODONE-ACETAMINOPHEN 5-325 MG PO TABS: 2.0000 | ORAL_TABLET | ORAL | Status: DC | PRN

## 2013-12-27 MED ORDER — SIMETHICONE 80 MG PO CHEW: 80.0000 mg | CHEWABLE_TABLET | ORAL | Status: DC | PRN

## 2013-12-27 MED ORDER — SENNOSIDES-DOCUSATE SODIUM 8.6-50 MG PO TABS
2.0000 | ORAL_TABLET | ORAL | Status: DC
  Administered 2013-12-28 (×2): 2 via ORAL
  Filled 2013-12-27 (×2): qty 2

## 2013-12-27 MED ORDER — BENZOCAINE-MENTHOL 20-0.5 % EX AERO: 1.0000 "application " | INHALATION_SPRAY | CUTANEOUS | Status: DC | PRN

## 2013-12-27 MED ORDER — PHENYLEPHRINE 40 MCG/ML (10ML) SYRINGE FOR IV PUSH (FOR BLOOD PRESSURE SUPPORT)
80.0000 ug | PREFILLED_SYRINGE | INTRAVENOUS | Status: DC | PRN
  Filled 2013-12-27: qty 2

## 2013-12-27 MED ORDER — LACTATED RINGERS IV SOLN
INTRAVENOUS | Status: DC
  Administered 2013-12-27 (×3): via INTRAVENOUS

## 2013-12-27 MED ORDER — ONDANSETRON HCL 4 MG/2ML IJ SOLN: 4.0000 mg | INTRAMUSCULAR | Status: DC | PRN

## 2013-12-27 MED ORDER — IBUPROFEN 600 MG PO TABS
600.0000 mg | ORAL_TABLET | Freq: Four times a day (QID) | ORAL | Status: DC
  Administered 2013-12-27 – 2013-12-29 (×7): 600 mg via ORAL
  Filled 2013-12-27 (×7): qty 1

## 2013-12-27 MED ORDER — OXYCODONE-ACETAMINOPHEN 5-325 MG PO TABS: 1.0000 | ORAL_TABLET | ORAL | Status: DC | PRN

## 2013-12-27 MED ORDER — PRENATAL MULTIVITAMIN CH
1.0000 | ORAL_TABLET | Freq: Every day | ORAL | Status: DC
  Administered 2013-12-28 – 2013-12-29 (×2): 1 via ORAL
  Filled 2013-12-27 (×2): qty 1

## 2013-12-27 MED ORDER — LANOLIN HYDROUS EX OINT: TOPICAL_OINTMENT | CUTANEOUS | Status: DC | PRN

## 2013-12-27 MED ORDER — ACETAMINOPHEN 325 MG PO TABS: 650.0000 mg | ORAL_TABLET | ORAL | Status: DC | PRN

## 2013-12-27 MED ORDER — FENTANYL 2.5 MCG/ML BUPIVACAINE 1/10 % EPIDURAL INFUSION (WH - ANES)
14.0000 mL/h | INTRAMUSCULAR | Status: DC | PRN
  Administered 2013-12-27: 14 mL/h via EPIDURAL
  Filled 2013-12-27: qty 125

## 2013-12-27 MED ORDER — SODIUM CHLORIDE 0.9 % IJ SOLN: 3.0000 mL | INTRAMUSCULAR | Status: DC | PRN

## 2013-12-27 MED ORDER — LIDOCAINE HCL (PF) 1 % IJ SOLN
INTRAMUSCULAR | Status: DC | PRN
  Administered 2013-12-27 (×2): 4 mL

## 2013-12-27 MED ORDER — DIBUCAINE 1 % RE OINT: 1.0000 "application " | TOPICAL_OINTMENT | RECTAL | Status: DC | PRN

## 2013-12-27 MED ORDER — DIPHENHYDRAMINE HCL 50 MG/ML IJ SOLN: 12.5000 mg | INTRAMUSCULAR | Status: DC | PRN

## 2013-12-27 MED ORDER — SODIUM CHLORIDE 0.9 % IJ SOLN: 3.0000 mL | Freq: Two times a day (BID) | INTRAMUSCULAR | Status: DC

## 2013-12-27 MED ORDER — PHENYLEPHRINE 40 MCG/ML (10ML) SYRINGE FOR IV PUSH (FOR BLOOD PRESSURE SUPPORT)
80.0000 ug | PREFILLED_SYRINGE | INTRAVENOUS | Status: DC | PRN
  Filled 2013-12-27: qty 2
  Filled 2013-12-27: qty 10

## 2013-12-27 MED ORDER — LIDOCAINE HCL (PF) 1 % IJ SOLN
30.0000 mL | INTRAMUSCULAR | Status: DC | PRN
  Filled 2013-12-27: qty 30

## 2013-12-27 MED ORDER — LACTATED RINGERS IV SOLN: 500.0000 mL | INTRAVENOUS | Status: DC | PRN

## 2013-12-27 MED ORDER — ZOLPIDEM TARTRATE 5 MG PO TABS
5.0000 mg | ORAL_TABLET | Freq: Every evening | ORAL | Status: DC | PRN
Start: 2013-12-27 — End: 2013-12-29

## 2013-12-27 MED ORDER — OXYTOCIN 40 UNITS IN LACTATED RINGERS INFUSION - SIMPLE MED: 62.5000 mL/h | INTRAVENOUS | Status: DC | PRN

## 2013-12-27 MED ORDER — WITCH HAZEL-GLYCERIN EX PADS: 1.0000 "application " | MEDICATED_PAD | CUTANEOUS | Status: DC | PRN

## 2013-12-27 MED ORDER — SODIUM CHLORIDE 0.9 % IV SOLN: 250.0000 mL | INTRAVENOUS | Status: DC | PRN

## 2013-12-27 NOTE — Progress Notes (Signed)
LABOR PROGRESS NOTE  Gabrielle Murphy is a 38 y.o. G3P2002 at 134w0d  admitted for induction of labor due to prolonged pregnancy.  Subjective: Feeling more uncomfortable, would like to have membranes ruptured  Objective: BP 109/64 mmHg  Pulse 68  Temp(Src) 97 F (36.1 C) (Oral)  Resp 18  Ht 5\' 5"  (1.651 m)  Wt 175 lb (79.379 kg)  BMI 29.12 kg/m2  LMP 03/15/2013 or  Filed Vitals:   12/27/13 1105 12/27/13 1210 12/27/13 1315 12/27/13 1425  BP: 113/68 112/62 114/66 109/64  Pulse: 78 72 74 68  Temp:  97 F (36.1 C)    TempSrc:  Oral    Resp: 18 18 18    Height:      Weight:           FHT:  FHR: 140 bpm, variability: moderate,  accelerations:  Present,  decelerations:  Absent UC:   Irregular q4-746min SVE:   Dilation: 4.5 Effacement (%): 80 Station: -1 Exam by:: Smith  Dilation: 4.5 Effacement (%): 80 Station: -1 Presentation: Vertex Exam by:: Bed Bath & BeyondSmith   Labs: Lab Results  Component Value Date   WBC 10.3 12/27/2013   HGB 13.4 12/27/2013   HCT 37.6 12/27/2013   MCV 95.7 12/27/2013   PLT 166 12/27/2013    Assessment / Plan: induction of labor 2/2 prolonged pregnancy  Labor: Progressing normally, AROM this check, clear Fetal Wellbeing:  Category I Pain Control:  Labor support without medications Anticipated MOD:  NSVD  Natalie (on call CNM notified of impending water birth)  Perry MountACOSTA,Kinga Cassar ROCIO, MD 12/27/2013, 3:34 PM

## 2013-12-27 NOTE — Anesthesia Preprocedure Evaluation (Signed)
Anesthesia Evaluation  Patient identified by MRN, date of birth, ID band Patient awake    Reviewed: Allergy & Precautions, H&P , NPO status , Patient's Chart, lab work & pertinent test results  Airway Mallampati: II  TM Distance: >3 FB Neck ROM: Full    Dental no notable dental hx.    Pulmonary asthma ,  breath sounds clear to auscultation  Pulmonary exam normal       Cardiovascular negative cardio ROS  Rhythm:Regular Rate:Normal     Neuro/Psych negative neurological ROS  negative psych ROS   GI/Hepatic negative GI ROS, Neg liver ROS,   Endo/Other  Hypothyroidism   Renal/GU negative Renal ROS     Musculoskeletal negative musculoskeletal ROS (+)   Abdominal   Peds  Hematology negative hematology ROS (+)   Anesthesia Other Findings   Reproductive/Obstetrics (+) Pregnancy                             Anesthesia Physical Anesthesia Plan  ASA: II  Anesthesia Plan: Epidural   Post-op Pain Management:    Induction:   Airway Management Planned:   Additional Equipment:   Intra-op Plan:   Post-operative Plan:   Informed Consent: I have reviewed the patients History and Physical, chart, labs and discussed the procedure including the risks, benefits and alternatives for the proposed anesthesia with the patient or authorized representative who has indicated his/her understanding and acceptance.     Plan Discussed with:   Anesthesia Plan Comments:         Anesthesia Quick Evaluation

## 2013-12-27 NOTE — H&P (Signed)
LABOR ADMISSION HISTORY AND PHYSICAL  Gabrielle Murphy is a 38 y.o. female G3P2002 with IUP at 749w0d by 4173w3d sono presenting for IOL secondary to prolonged pregnancy. She reports +FMs, No LOF, no VB, no blurry vision, headaches or peripheral edema, and RUQ pain. She plans on breast feeding. She request vasectomy for birth control.  Dating: By 7273w3d dating sono --->  Estimated Date of Delivery: 12/20/13   Prenatal History/Complications:  Past Medical History: Past Medical History  Diagnosis Date  . Hypothyroid     DX thru integrated Dr    Past Surgical History: Past Surgical History  Procedure Laterality Date  . Cholecystectomy    . Wisdom tooth extraction      Obstetrical History: OB History    Gravida Para Term Preterm AB TAB SAB Ectopic Multiple Living   3 2 2       2       Social History: History   Social History  . Marital Status: Married    Spouse Name: N/A    Number of Children: N/A  . Years of Education: N/A   Occupational History  . personal trainer    Social History Main Topics  . Smoking status: Never Smoker   . Smokeless tobacco: Never Used  . Alcohol Use: No  . Drug Use: No  . Sexual Activity:    Partners: Male   Other Topics Concern  . None   Social History Narrative    Family History: Family History  Problem Relation Age of Onset  . Spina bifida Sister   . Cancer Maternal Grandmother     pancreatic  . Cancer Paternal Grandfather     esophagus  . Cancer Maternal Grandmother   . Heart disease Maternal Grandmother     Allergies: Allergies  Allergen Reactions  . Penicillins Rash    Facility-administered medications prior to admission  Medication Dose Route Frequency Provider Last Rate Last Dose  . Influenza vac split quadrivalent PF (FLUARIX) injection 0.5 mL  0.5 mL Intramuscular Once [place] Smith, CNM       Prescriptions prior to admission  Medication Sig Dispense Refill Last Dose  . acetaminophen (TYLENOL) 325 MG tablet Take  650 mg by mouth every 6 (six) hours as needed for mild pain.    Past Week at Unknown time  . calcium carbonate (TUMS - DOSED IN MG ELEMENTAL CALCIUM) 500 MG chewable tablet Chew 2 tablets by mouth daily as needed for indigestion or heartburn.   12/26/2013 at Unknown time  . Omega-3 Fatty Acids (FISH OIL PO) Take 1 tablet by mouth daily.   Past Week at Unknown time  . Prenatal Multivit-Min-Fe-FA (PRENATAL VITAMINS PO) Take 1 tablet by mouth daily.    Past Week at Unknown time     Review of Systems   All systems reviewed and negative except as stated in HPI  Blood pressure 104/64, pulse 76, temperature 97.9 F (36.6 C), temperature source Oral, resp. rate 16, height 5\' 5"  (1.651 m), weight 175 lb (79.379 kg), last menstrual period 03/15/2013. General appearance: alert and cooperative Lungs: clear to auscultation bilaterally Heart: regular rate and rhythm Abdomen: soft, non-tender; bowel sounds normal Extremities: Homans sign is negative, no sign of DVT Presentation: cephalic Fetal monitoringBaseline: 140 bpm, Variability: Good {> 6 bpm), Accelerations: Reactive and Decelerations: Absent Uterine activityrare  Dilation: 3 Effacement (%): 80 Station: -2 Exam by:: Katrinka BlazingSmith RN   Prenatal labs: ABO, Rh: O/POS/-- (03/27 0953) Antibody: NEG (03/27 0953) Rubella:   RPR: NON REAC (08/14  1030)  HBsAg: NEGATIVE (03/27 0953)  HIV: NONREACTIVE (08/14 1030)  GBS: Negative (10/12 0000)  1 hr Glucola 97 Genetic screening  declined Anatomy [place] normal  Waterbirth [x] class consent[x]  [place]  Dating  7 wk US agrees with dates  Genetic Screen 1 Screen:   Declines  AFP:                    Quad:                  NIPS:  Anatomic US Nml, Female "Charlie"  GTT Early:   n/a        Third trimester: 97  TDaP vaccine 09/26/13  Flu vaccine 11/10/13  GBS negative  Contraception  Vasectomy  Baby Food  Breast  Circumcision  NA  Pediatrician  [facility]  Support Person FOB John and Mother   Was told she was hypothyroid, but her TSH is normal    Results for orders placed or performed during the hospital encounter of 12/27/13 (from the past 24 hour(s))  CBC   Collection Time: 12/27/13  7:56 AM  Result Value Ref Range   WBC 10.3 4.0 - 10.5 K/uL   RBC 3.93 3.87 - 5.11 MIL/uL   Hemoglobin 13.4 12.0 - 15.0 g/dL   HCT 16.137.6 09.636.0 - 04.546.0 %   MCV 95.7 78.0 - 100.0 fL   MCH 34.1 (H) 26.0 - 34.0 pg   MCHC 35.6 30.0 - 36.0 g/dL   RDW 40.912.8 81.111.5 - 91.415.5 %   Platelets 166 150 - 400 K/uL    Patient Active Problem List   Diagnosis Date Noted  . Pregnancy 12/27/2013  . Breech presentation 12/10/2013  . 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 Supervision of normal intrauterine pregnancy in multigravida in first trimester 05/09/2013    Assessment: Gabrielle Murphy is a 38 y.o. G3P2002 at [redacted]w[redacted]d here for IOL secondary to prolonged pregnancy  #Labor: cytotec, then AROM as would prefer to hold on pitocin #Pain: declines #FWB: Cat I #ID:  GBS neg #MOF: breast #MOC:vasectomy #Circ:  N/a, female  Jahmari Esbenshade ROCIO 12/27/2013, 10:57 AM

## 2013-12-28 NOTE — Plan of Care (Signed)
Problem: Phase II Progression Outcomes Goal: Progress activity as tolerated unless otherwise ordered Outcome: Completed/Met Date Met:  12/28/13 Goal: Afebrile, VS remain stable Outcome: Completed/Met Date Met:  12/28/13 Goal: Tolerating diet Outcome: Not Applicable Date Met:  93/40/68

## 2013-12-28 NOTE — Anesthesia Postprocedure Evaluation (Signed)
Anesthesia Post Note  Patient: Gabrielle Murphy  Procedure(s) Performed: * No procedures listed *  Anesthesia type: Epidural  Patient location: Mother/Baby  Post pain: Pain level controlled  Post assessment: Post-op Vital signs reviewed  Last Vitals:  Filed Vitals:   12/28/13 0517  BP: 103/55  Pulse: 60  Temp: 36.6 C  Resp: 18    Post vital signs: Reviewed  Level of consciousness:alert  Complications: No apparent anesthesia complications

## 2013-12-28 NOTE — Progress Notes (Signed)
Post Partum Day 1 Subjective: no complaints, up ad lib, voiding and breastfeeding well.  Reports decreased bleeding and pain.  Objective: Blood pressure 103/55, pulse 60, temperature 97.8 F (36.6 C), temperature source Oral, resp. rate 18, height 5\' 5"  (1.651 m), weight 79.379 kg (175 lb), last menstrual period 03/15/2013, SpO2 81 %, unknown if currently breastfeeding.  Physical Exam:  Filed Vitals:   12/28/13 0517  BP: 103/55  Pulse: 60  Temp: 97.8 F (36.6 C)  Resp: 18   General: alert, cooperative and appears stated age Lochia: appropriate Uterine Fundus: firm Incision: n/a DVT Evaluation: No evidence of DVT seen on physical exam. Negative Homan's sign.   Recent Labs  12/27/13 0756  HGB 13.4  HCT 37.6    Assessment/Plan: Plan for discharge tomorrow and Breastfeeding   LOS: 1 day   Rochele PagesKARIM, Florean Hoobler N 12/28/2013, 7:01 AM

## 2013-12-28 NOTE — Plan of Care (Signed)
Problem: Phase I Progression Outcomes Goal: Pain controlled with appropriate interventions Outcome: Completed/Met Date Met:  12/28/13 Goal: Voiding adequately Outcome: Completed/Met Date Met:  12/28/13 Goal: OOB as tolerated unless otherwise ordered Outcome: Completed/Met Date Met:  12/28/13 Goal: VS, stable, temp < 100.4 degrees F Outcome: Completed/Met Date Met:  12/28/13 Goal: Initial discharge plan identified Outcome: Not Applicable Date Met:  21/11/73 Goal: Other Phase I Outcomes/Goals Outcome: Not Met (add Reason)

## 2013-12-28 NOTE — Plan of Care (Signed)
Problem: Phase I Progression Outcomes Goal: Other Phase I Outcomes/Goals Outcome: Completed/Met Date Met:  12/28/13     

## 2013-12-28 NOTE — Lactation Note (Signed)
This note was copied from the chart of Gabrielle Minna Antislisabeth Adamczak. Lactation Consultation Note:Mother is an experienced breastfeeding mother with 2 other children. Mother states that she breastfed her first child for 6 months and the last child for 10 weeks. Mother states that her infant is breastfeeding well. She states her nipples were slightly sore and staff nurse gave her comfort gels. Reviewed hand expression and advised to apply colostrum after each feeding. Reviewed proper support and postioning with mother. Mother advised to breastfeed infant 8-12 times in 24 hours. She was given the Lactation Brochure and informed of all breastfeeding services and community support. Mother to page for assistance as needed.   Patient Name: Gabrielle Murphy [id number]'s Date: 12/28/2013 Reason for consult: Initial assessment   Maternal Data Has patient been taught Hand Expression?: Yes (mother states she is able to hand express and sees colostrum) Does the patient have breastfeeding experience prior to this delivery?: Yes  Feeding    LATCH Score/Interventions                      Lactation Tools Discussed/Used     Consult Status Consult Status: Follow-up Date: 12/28/13 Follow-up type: In-patient    Stevan BornKendrick, [name] 12/28/2013, 2:52 PM

## 2013-12-28 NOTE — Plan of Care (Signed)
Problem: Phase II Progression Outcomes Goal: Pain controlled on oral analgesia Outcome: Completed/Met Date Met:  12/28/13

## 2013-12-29 MED ORDER — IBUPROFEN 600 MG PO TABS: 600.0000 mg | ORAL_TABLET | ORAL | Status: DC | PRN

## 2013-12-29 NOTE — Plan of Care (Signed)
Problem: Phase II Progression Outcomes Goal: Other Phase II Outcomes/Goals Outcome: Not Applicable Date Met:  12/29/13     

## 2013-12-29 NOTE — Discharge Instructions (Signed)
Postpartum Care After Vaginal Delivery °After you deliver your newborn (postpartum period), the usual stay in the hospital is 24-72 hours. If there were problems with your labor or delivery, or if you have other medical problems, you might be in the hospital longer.  °While you are in the hospital, you will receive help and instructions on how to care for yourself and your newborn during the postpartum period.  °While you are in the hospital: °· Be sure to tell your nurses if you have pain or discomfort, as well as where you feel the pain and what makes the pain worse. °· If you had an incision made near your vagina (episiotomy) or if you had some tearing during delivery, the nurses may put ice packs on your episiotomy or tear. The ice packs may help to reduce the pain and swelling. °· If you are breastfeeding, you may feel uncomfortable contractions of your uterus for a couple of weeks. This is normal. The contractions help your uterus get back to normal size. °· It is normal to have some bleeding after delivery. °¨ For the first 1-3 days after delivery, the flow is red and the amount may be similar to a period. °¨ It is common for the flow to start and stop. °¨ In the first few days, you may pass some small clots. Let your nurses know if you begin to pass large clots or your flow increases. °¨ Do not  flush blood clots down the toilet before having the nurse look at them. °¨ During the next 3-10 days after delivery, your flow should become more watery and pink or brown-tinged in color. °¨ Ten to fourteen days after delivery, your flow should be a small amount of yellowish-white discharge. °¨ The amount of your flow will decrease over the first few weeks after delivery. Your flow may stop in 6-8 weeks. Most women have had their flow stop by 12 weeks after delivery. °· You should change your sanitary pads frequently. °· Wash your hands thoroughly with soap and water for at least 20 seconds after changing pads, using  the toilet, or before holding or feeding your newborn. °· You should feel like you need to empty your bladder within the first 6-8 hours after delivery. °· In case you become weak, lightheaded, or faint, call your nurse before you get out of bed for the first time and before you take a shower for the first time. °· Within the first few days after delivery, your breasts may begin to feel tender and full. This is called engorgement. Breast tenderness usually goes away within 48-72 hours after engorgement occurs. You may also notice milk leaking from your breasts. If you are not breastfeeding, do not stimulate your breasts. Breast stimulation can make your breasts produce more milk. °· Spending as much time as possible with your newborn is very important. During this time, you and your newborn can feel close and get to know each other. Having your newborn stay in your room (rooming in) will help to strengthen the bond with your newborn.  It will give you time to get to know your newborn and become comfortable caring for your newborn. °· Your hormones change after delivery. Sometimes the hormone changes can temporarily cause you to feel sad or tearful. These feelings should not last more than a few days. If these feelings last longer than that, you should talk to your caregiver. °· If desired, talk to your caregiver about methods of family planning or contraception. °·   Talk to your caregiver about immunizations. Your caregiver may want you to have the following immunizations before leaving the hospital: °¨ Tetanus, diphtheria, and pertussis (Tdap) or tetanus and diphtheria (Td) immunization. It is very important that you and your family (including grandparents) or others caring for your newborn are up-to-date with the Tdap or Td immunizations. The Tdap or Td immunization can help protect your newborn from getting ill. °¨ Rubella immunization. °¨ Varicella (chickenpox) immunization. °¨ Influenza immunization. You should  receive this annual immunization if you did not receive the immunization during your pregnancy. °Document Released: 11/27/2006 Document Revised: 10/25/2011 Document Reviewed: 09/27/2011 °ExitCare® Patient Information ©2015 [employer]. This information is not intended to replace advice given to you by your health care provider. Make sure you discuss any questions you have with your health care provider. ° °Breastfeeding Challenges and Solutions °Even though breastfeeding is natural, it can be challenging, especially in the first few weeks after childbirth. It is normal for problems to arise when starting to breastfeed your new baby, even if you have breastfed before. This document provides some solutions to the most common breastfeeding challenges.  °CHALLENGES AND SOLUTIONS °Challenge--Cracked or Sore Nipples °Cracked or sore nipples are commonly experienced by breastfeeding mothers. Cracked or sore nipples often are caused by inadequate latching (when your baby's mouth attaches to your breast to breastfeed). Soreness can also happen if your baby is not positioned properly at your breast. Although nipple cracking and soreness are common during the first week after birth, nipple pain is never normal. If you experience nipple cracking or soreness that lasts longer than 1 week or nipple pain, call your health care provider or lactation consultant.  °Solution °Ensure proper latching and positioning of your baby by following the steps below: °· Find a comfortable place to sit or lie down, with your neck and back well supported. °· Place a pillow or rolled up blanket under your baby to bring him or her to the level of your breast (if you are seated). °· Make sure that your baby's abdomen is facing your abdomen. °· Gently massage your breast. With your fingertips, massage from your chest wall toward your nipple in a circular motion. This encourages milk flow. You may need to continue this action during the feeding if  your milk flows slowly. °· Support your breast with 4 fingers underneath and your thumb above your nipple. Make sure your fingers are well away from your nipple and your baby's mouth. °· Stroke your baby's lips gently with your finger or nipple. °· When your baby's mouth is open wide enough, quickly bring your baby to your breast, placing your entire nipple and as much of the colored area around your nipple (areola) as possible into your baby's mouth. °¨ More areola should be visible above your baby's upper lip than below the lower lip. °¨ Your baby's tongue should be between his or her lower gum and your breast. °· Ensure that your baby's mouth is correctly positioned around your nipple (latched). Your baby's lips should create a seal on your breast and be turned out (everted). °· It is common for your baby to suck for about 2-3 minutes in order to start the flow of breast milk. °Signs that your baby has successfully latched on to your nipple include:  °· Quietly tugging or quietly sucking without causing you pain.   °· Swallowing heard between every 3-4 sucks.   °· Muscle movement above and in front of his or her ears with sucking.   °  Signs that your baby has not successfully latched on to nipple include:  °· Sucking sounds or smacking sounds from your baby while nursing.   °· Nipple pain.   °Ensure that your breasts stay moisturized and healthy by: °· Avoiding the use of soap on your nipples.   °· Wearing a supportive bra. Avoid wearing underwire-style bras or tight bras.   °· Air drying your nipples for 3-4 minutes after each feeding.   °· Using only cotton bra pads to absorb breast milk leakage. Leaking of breast milk between feedings is normal.  Be sure to change the pads if they become soaked with milk. °· Using lanolin on your nipples after nursing. Lanolin helps to maintain your skin's normal moisture barrier. If you use pure lanolin you do not need to wash it off before feeding your baby again. Pure  lanolin is not toxic to your baby.  You may also hand express a few drops of breast milk and gently massage that milk into your nipples, allowing it to air dry. °Challenge--Breast Engorgement °Breast engorgement is the overfilling of your breasts with breast milk. In the first few weeks after giving birth, you may experience breast engorgement. Breast engorgement can make your breasts throb and feel hard, tightly stretched, warm, and tender. Engorgement peaks about the fifth day after you give birth. Having breast engorgement does not mean you have to stop breastfeeding your baby. °Solution °· Breastfeed when you feel the need to reduce the fullness of your breasts or when your baby shows signs of hunger. This is called "breastfeeding on demand." °· Newborns (babies younger than 4 weeks) often breastfeed every 1-3 hours during the day. You may need to awaken your baby to feed if he or she is asleep at a feeding time. °· Do not allow your baby to sleep longer than 5 hours during the night without a feeding. °· Pump or hand express breast milk before breastfeeding to soften your breast, areola, and nipple. °· Apply warm, moist heat (in the shower or with warm water-soaked hand towels) just before feeding or pumping, or massage your breast before or during breastfeeding. This increases circulation and helps your milk to flow. °· Completely empty your breasts when breastfeeding or pumping. Afterward, wear a snug bra (nursing or regular) or tank top for 1-2 days to signal your body to slightly decrease milk production. Only wear snug bras or tank tops to treat engorgement. Tight bras typically should be avoided by breastfeeding mothers. Once engorgement is relieved, return to wearing regular, loose-fitting clothes. °· Apply ice packs to your breasts to lessen the pain from engorgement and relieve swelling, unless the ice is uncomfortable for you. °· Do not delay feedings. Try to relax when it is time to feed your baby.  This helps to trigger your "let-down reflex," which releases milk from your breast. °· Ensure your baby is latched on to your breast and positioned properly while breastfeeding. °· Allow your baby to remain at your breast as long as he or she is latched on well and actively sucking. Your baby will let you know when he or she is done breastfeeding by pulling away from your breast or falling asleep. °· Avoid introducing bottles or pacifiers to your baby in the early weeks of breastfeeding. Wait to introduce these things until after resolving any breastfeeding challenges. °· Try to pump your milk on the same schedule as when your baby would breastfeed if you are returning to work or away from home for an extended period. °· Drink   plenty of fluids to avoid dehydration, which can eventually put you at greater risk of breast engorgement. °If you follow these suggestions, your engorgement should improve in 24-48 hours. If you are still experiencing difficulty, call your lactation consultant or health care provider.  °Challenge--Plugged Milk Ducts °Plugged milk ducts occur when the duct does not drain milk effectively and becomes swollen. Wearing a tight-fitting nursing bra or having difficulty with latching may cause plugged milk ducts. Not drinking enough water (8-10 c [1.9-2.4 L] per day) can contribute to plugged milk ducts. Once a duct has become plugged, hard lumps, soreness, and redness may develop in your breast.  °Solution °Do not delay feedings. Feed your baby frequently and try to empty your breasts of milk at each feeding. Try breastfeeding from the affected side first so there is a better chance that the milk will drain completely from that breast. Apply warm, moist towels to your breasts for 5-10 minutes before feeding. Alternatively, a hot shower right before breastfeeding can provide the moist heat that can encourage milk flow. Gentle massage of the sore area before and during a feeding may also help. Avoid  wearing tight clothing or bras that put pressure on your breasts. Wear bras that offer good support to your breasts, but avoid underwire bras. If you have a plugged milk duct and develop a fever, you need to see your health care provider.  °Challenge--Mastitis °Mastitis is inflammation of your breast. It usually is caused by a bacterial infection and can cause flu-like symptoms. You may develop redness in your breast and a fever. Often when mastitis occurs, your breast becomes firm, warm, and very painful. The most common causes of mastitis are poor latching, ineffective sucking from your baby, consistent pressure on your breast (possibly from wearing a tight-fitting bra or shirt that restricts the milk flow), unusual stress or fatigue, or missed feedings.  °Solution °You will be given antibiotic medicine to treat the infection. It is still important to breastfeed frequently to empty your breasts. Continuing to breastfeed while you recover from mastitis will not harm your baby. Make sure your baby is positioned properly during every feeding. Apply moist heat to your breasts for a few minutes before feeding to help the milk flow and to help your breasts empty more easily. °Challenge--Thrush °Thrush is a yeast infection that can form on your nipples, in your breast, or in your baby's mouth. It causes itching, soreness, burning or stabbing pain, and sometimes a rash.  °Solution °You will be given a medicated ointment for your nipples, and your baby will be given a liquid medicine for his or her mouth. It is important that you and your baby are treated at the same time because thrush can be passed between you and your baby. Change disposable nursing pads often. Any bras, towels, or clothing that come in contact with infected areas of your body or your baby's body need to be washed in very hot water every day. Wash your hands and your baby's hands often. All pacifiers, bottle nipples, or toys your baby puts in his or her  mouth should be boiled once a day for 20 minutes. After 1 week of treatment, discard pacifiers and bottle nipples and buy new ones. All breast pump parts that touch the milk need to be boiled for 20 minutes every day. °Challenge--Low Milk Supply °You may not be producing enough milk if your baby is not gaining the proper amount of weight. Breast milk production is based on a   supply-and-demand system. Your milk supply depends on how frequently and effectively your baby empties your breast.  °Solution °The more you breastfeed and pump, the more breast milk you will produce. It is important that your baby empties at least one of your breasts at each feeding. If this is not happening, then use a breast pump or hand express any milk that remains. This will help to drain as much milk as possible at each feeding. It will also signal your body to produce more milk. If your baby is not emptying your breasts, it may be due to latching, sucking, or positioning problems. If low milk supply continues after addressing these issues, contact your health care provider or a lactation specialist as soon as possible. °Challenge--Inverted or Flat Nipples °Some women have nipples that turn inward instead of protruding outward. Other women have nipples that are flat. Inverted or flat nipples can sometimes make it more difficult for your baby to latch onto your breast. °Solution °You may be given a small device that pulls out inverted nipples. This device should be applied right before your baby is brought to your breast. You can also try using a breast pump for a short time before placing the baby at your breast. The pump can pull your nipple outwards to help your infant latch more easily. The baby's sucking motion will help the inverted nipple protrude as well.  °If you have flat nipples, encourage your baby to latch onto your breast and feed frequently in the early days after birth. This will give your baby practice latching on  correctly while your breast is still soft. When your milk supply increases, between the second and fifth day after birth and your breasts become full, your baby will have an easier time latching.  °Contact a lactation consultant if you still have concerns. She or he can teach you additional techniques to address breastfeeding problems related to nipple shape and position.  °FOR MORE INFORMATION °[employer]: www.llli.org °Document Released: 07/24/2005 Document Revised: 02/04/2013 Document Reviewed: 07/26/2012 °Exit[employer]® Patient Information ©2015 [employer]. This information is not intended to replace advice given to you by your health care provider. Make sure you discuss any questions you have with your health care provider. ° °

## 2013-12-29 NOTE — Discharge Summary (Signed)
Obstetric Discharge Summary Reason for Admission: induction of labor Prenatal Procedures: NST and ultrasound Intrapartum Procedures: spontaneous vaginal delivery Postpartum Procedures: none Complications-Operative and Postpartum: none HEMOGLOBIN  Date Value Ref Range Status  12/27/2013 13.4 12.0 - 15.0 g/dL Final   HCT  Date Value Ref Range Status  12/27/2013 37.6 36.0 - 46.0 % Final    Physical Exam:  General: alert, cooperative, appears stated age and no distress Lochia: appropriate Uterine Fundus: firm Incision: NA DVT Evaluation: Negative Homan's sign.  Discharge Diagnoses: Term Pregnancy-delivered and Post-date pregnancy  Discharge Information: Date: 12/29/2013 Activity: pelvic rest Diet: routine Medications: PNV and Ibuprofen Condition: stable Instructions: refer to practice specific booklet Discharge to: home Follow-up Information    [facility] [place] [facility] 6 [facility].   Specialty:  Obstetrics and Gynecology   Contact information:   [address], [address] [place] [id number] [phone]      [facility] [place] [facility].   Why:  As needed in emergencies   Contact information:   [id number] [address] [place] [id number] [phone]      Newborn Data: Live born female  Birth Weight: 8 lb 9.2 oz (3890 g) APGAR: 9, 9  Home with mother.  Gabrielle KinsmanSMITH, Gabrielle Murphy 12/29/2013, 9:42 AM

## 2013-12-29 NOTE — Plan of Care (Signed)
Problem: Discharge Progression Outcomes Goal: Tolerating diet Outcome: Completed/Met Date Met:  12/29/13

## 2013-12-29 NOTE — Plan of Care (Signed)
Problem: Discharge Progression Outcomes Goal: Activity appropriate for discharge plan Outcome: Completed/Met Date Met:  12/29/13     

## 2013-12-29 NOTE — Plan of Care (Signed)
Problem: Discharge Progression Outcomes Goal: Discharge plan in place and appropriate Outcome: Completed/Met Date Met:  12/29/13

## 2013-12-29 NOTE — Anesthesia Procedure Notes (Signed)
Epidural Patient location during procedure: OB  Staffing Anesthesiologist: Criselda Starke R Performed by: anesthesiologist   Preanesthetic Checklist Completed: patient identified, pre-op evaluation, timeout performed, IV checked, risks and benefits discussed and monitors and equipment checked  Epidural Patient position: sitting Prep: site prepped and draped and DuraPrep Patient monitoring: heart rate Approach: midline Location: L3-L4 Injection technique: LOR air and LOR saline  Needle:  Needle type: Tuohy  Needle gauge: 17 G Needle length: 9 cm Needle insertion depth: 6 cm Catheter type: closed end flexible Catheter size: 19 Gauge Catheter at skin depth: 12 cm Test dose: negative  Assessment Sensory level: T8 Events: blood not aspirated, injection not painful, no injection resistance, negative IV test and no paresthesia  Additional Notes Reason for block:procedure for pain   

## 2013-12-29 NOTE — Lactation Note (Addendum)
This note was copied from the chart of Gabrielle Minna Antislisabeth Talcott. Lactation Consultation Note Being d/c home today. Baby only had 4% wt. Loss. Only had 2 voids and 4 poops since birth. Mom states baby is very fussy, acts like she is starving, mom hears gulping during feedings, buts states the baby wants to have something in her mouth to suck on continuously. Moms everted nipples are cracked and painful. Mom supplemented once w/2610ml formula and stated that was the first time the baby was completely satisfied. Noted when baby cried had slight indention in tongue. Mom denied other children having that issue, but states that her 2nd child had difficulty BF and wouldn't latch properly. Assed mom's breast fitted w/#20, #24 NS d/t issues and moms nipples cracked and painful. #20 feels fine mom states, but encouraged when milk comes in may need to use #24 if nipple size increases. Dr. Ave Filterhandler into assess baby and agreed tongue tie issues and mom signed consent and Dr. Staci Acostalipped tongue. Assessed BF after procedure, no bleeding noted. Used #20 NS and mom stated felt much better. Gave comfort gels and shells to prevent nipples from rubbing on clothing. Encouraged follow up appt. W/LC. Mom tearful and thankful.  Patient Name: Gabrielle Murphy ZOXWR'UToday's Date: 12/29/2013 Reason for consult: Follow-up assessment;Breast/nipple pain   Maternal Data    Feeding    LATCH Score/Interventions    Intervention(s): Hand expression  Type of Nipple: Everted at rest and after stimulation  Comfort (Breast/Nipple): Engorged, cracked, bleeding, large blisters, severe discomfort Problem noted: Cracked, bleeding, blisters, bruises Intervention(s): Expressed breast milk to nipple  Problem noted: Cracked, bleeding, blisters, bruises;Severe discomfort Interventions  (Cracked/bleeding/bruising/blister): Expressed breast milk to nipple        Lactation Tools Discussed/Used Tools: Comfort gels;Shells;Nipple Shields Nipple  shield size: 20;24 Shell Type: Inverted   Consult Status Consult Status: Complete Date: 12/29/13 Follow-up type: Call as needed    Gabrielle Murphy, Gabrielle NickelLAURA G 12/29/2013, 10:08 AM

## 2014-02-09 ENCOUNTER — Encounter: Payer: Self-pay | Admitting: Advanced Practice Midwife

## 2014-02-09 ENCOUNTER — Ambulatory Visit (INDEPENDENT_AMBULATORY_CARE_PROVIDER_SITE_OTHER): Payer: No Typology Code available for payment source | Admitting: Advanced Practice Midwife

## 2014-02-09 VITALS — BP 115/78 | HR 73 | Resp 16 | Ht 66.0 in | Wt 150.0 lb

## 2014-02-09 DIAGNOSIS — F53 Postpartum depression: Secondary | ICD-10-CM | POA: Insufficient documentation

## 2014-02-09 DIAGNOSIS — F418 Other specified anxiety disorders: Secondary | ICD-10-CM

## 2014-02-09 DIAGNOSIS — F411 Generalized anxiety disorder: Secondary | ICD-10-CM

## 2014-02-09 DIAGNOSIS — O99345 Other mental disorders complicating the puerperium: Secondary | ICD-10-CM

## 2014-02-09 MED ORDER — SERTRALINE HCL 50 MG PO TABS: 50.0000 mg | ORAL_TABLET | Freq: Every day | ORAL | Status: DC

## 2014-02-09 MED ORDER — BUSPIRONE HCL 10 MG PO TABS: 10.0000 mg | ORAL_TABLET | Freq: Three times a day (TID) | ORAL | Status: DC | PRN

## 2014-02-09 MED ORDER — SERTRALINE HCL 25 MG PO TABS: 25.0000 mg | ORAL_TABLET | Freq: Every day | ORAL | Status: DC

## 2014-02-09 NOTE — Progress Notes (Signed)
  Subjective:     Gabrielle Murphy is a 38 y.o. female who presents for a postpartum visit. She is 7 weeks postpartum following a spontaneous vaginal delivery. I have fully reviewed the prenatal and intrapartum course. The delivery was at 41 gestational weeks. Outcome: spontaneous vaginal delivery. Anesthesia: epidural. Pt planned waterbirth and had IOL for postdates with Cytotec and AROM and did use tub for labor support.  But, she desired epidural and had to get out of the tub.  Postpartum course has been normal physically but report of feelings of sadness and gloom and feelings of anxiety. Baby's course has been normal. Baby is feeding by mostly breast with some occasional formula. Bleeding no bleeding. Bowel function is normal. Bladder function is normal. Patient is not sexually active. Contraception method is vasectomy. Postpartum depression screening: positive.  She denies feeling like harming herself or her baby but reports she needs help with her feelings of anxiety and negativity.  The following portions of the patient's history were reviewed and updated as appropriate: allergies, current medications, past family history, past medical history, past social history, past surgical history and problem list.  Delivery Note on 12/27/13: At 6:57 PM a viable female was delivered via Vaginal, Spontaneous Delivery (Presentation: Left Occiput Anterior). APGAR: 9, 9; weight pending  Placenta status: Intact, Spontaneous. Cord: 3 vessels with the following complications: None  Anesthesia: Epidural  Episiotomy: None Lacerations: small hemostatic/well approximated clitoral laceration Suture Repair: none Est. Blood Loss (mL): 300  Mom to postpartum. Baby to Couplet care / Skin to Skin.  Review of Systems A comprehensive review of systems was negative.   Objective:    BP 115/78 mmHg  Pulse 73  Resp 16  Ht 5\' 6"  (1.676 m)  Wt 150 lb (68.04 kg)  BMI 24.22 kg/m2  Breastfeeding? Yes  General:   alert, cooperative and no distress   Breasts:  negative  Lungs: acyanotic, no signs of respiratory distress  Heart:    Abdomen: soft, non-tender; bowel sounds normal; no masses,  no organomegaly   Vulva:  pelvic exam deferred        Assessment:     Routine postpartum exam. Postpartum depression and anxiety   Plan:    1. Contraception: vasectomy 2. Zoloft and Buspar Rx sent to pt pharmacy 3. Follow up in: 2 months to evaluate depression/medicaitons or as needed.

## 2014-02-11 ENCOUNTER — Telehealth: Payer: Self-pay | Admitting: *Deleted

## 2014-02-11 NOTE — Telephone Encounter (Signed)
Pt called stating that she started taking Buspar TID and Zoloft 25 mg on Monday.  She is feeling more anxiety and feels like it may be the Buspar.  She is going to hold the Buspar and increase the Zoloft to 50 mg.  She will call the office on Monday for an update.  She is instructed to to seek medical attention if she gets into a crisis over the weekend.  She states that she may try to use Melatonin 3 mg in the evening to help with sleep.

## 2014-02-15 ENCOUNTER — Encounter (HOSPITAL_COMMUNITY): Payer: Self-pay | Admitting: *Deleted

## 2014-02-15 ENCOUNTER — Inpatient Hospital Stay (HOSPITAL_COMMUNITY)
Admission: AD | Admit: 2014-02-15 | Discharge: 2014-02-15 | Disposition: A | Discharge status: Home / Self Care | Payer: PRIVATE HEALTH INSURANCE | Source: Ambulatory Visit | Attending: Obstetrics & Gynecology | Admitting: Obstetrics & Gynecology

## 2014-02-15 DIAGNOSIS — F411 Generalized anxiety disorder: Secondary | ICD-10-CM

## 2014-02-15 DIAGNOSIS — G4700 Insomnia, unspecified: Secondary | ICD-10-CM | POA: Insufficient documentation

## 2014-02-15 DIAGNOSIS — O99345 Other mental disorders complicating the puerperium: Secondary | ICD-10-CM | POA: Diagnosis present

## 2014-02-15 DIAGNOSIS — F53 Postpartum depression: Secondary | ICD-10-CM

## 2014-02-15 DIAGNOSIS — F418 Other specified anxiety disorders: Secondary | ICD-10-CM

## 2014-02-15 DIAGNOSIS — F419 Anxiety disorder, unspecified: Secondary | ICD-10-CM | POA: Insufficient documentation

## 2014-02-15 MED ORDER — ALPRAZOLAM 0.25 MG PO TABS: 0.2500 mg | ORAL_TABLET | Freq: Every evening | ORAL | Status: DC | PRN

## 2014-02-15 MED ORDER — ZOLPIDEM TARTRATE 5 MG PO TABS: 5.0000 mg | ORAL_TABLET | Freq: Every evening | ORAL | Status: DC | PRN

## 2014-02-15 NOTE — Discharge Instructions (Signed)
Insomnia Insomnia is frequent trouble falling and/or staying asleep. Insomnia can be a long term problem or a short term problem. Both are common. Insomnia can be a short term problem when the wakefulness is related to a certain stress or worry. Long term insomnia is often related to ongoing stress during waking hours and/or poor sleeping habits. Overtime, sleep deprivation itself can make the problem worse. Every little thing feels more severe because you are overtired and your ability to cope is decreased. CAUSES   Stress, anxiety, and depression.  Poor sleeping habits.  Distractions such as TV in the bedroom.  Naps close to bedtime.  Engaging in emotionally charged conversations before bed.  Technical reading before sleep.  Alcohol and other sedatives. They may make the problem worse. They can hurt normal sleep patterns and normal dream activity.  Stimulants such as caffeine for several hours prior to bedtime.  Pain syndromes and shortness of breath can cause insomnia.  Exercise late at night.  Changing time zones may cause sleeping problems (jet lag). It is sometimes helpful to have someone observe your sleeping patterns. They should look for periods of not breathing during the night (sleep apnea). They should also look to see how long those periods last. If you live alone or observers are uncertain, you can also be observed at a sleep clinic where your sleep patterns will be professionally monitored. Sleep apnea requires a checkup and treatment. Give your caregivers your medical history. Give your caregivers observations your family has made about your sleep.  SYMPTOMS   Not feeling rested in the morning.  Anxiety and restlessness at bedtime.  Difficulty falling and staying asleep. TREATMENT   Your caregiver may prescribe treatment for an underlying medical disorders. Your caregiver can give advice or help if you are using alcohol or other drugs for self-medication. Treatment  of underlying problems will usually eliminate insomnia problems.  Medications can be prescribed for short time use. They are generally not recommended for lengthy use.  Over-the-counter sleep medicines are not recommended for lengthy use. They can be habit forming.  You can promote easier sleeping by making lifestyle changes such as:  Using relaxation techniques that help with breathing and reduce muscle tension.  Exercising earlier in the day.  Changing your diet and the time of your last meal. No night time snacks.  Establish a regular time to go to bed.  Counseling can help with stressful problems and worry.  Soothing music and white noise may be helpful if there are background noises you cannot remove.  Stop tedious detailed work at least one hour before bedtime. HOME CARE INSTRUCTIONS   Keep a diary. Inform your caregiver about your progress. This includes any medication side effects. See your caregiver regularly. Take note of:  Times when you are asleep.  Times when you are awake during the night.  The quality of your sleep.  How you feel the next day. This information will help your caregiver care for you.  Get out of bed if you are still awake after 15 minutes. Read or do some quiet activity. Keep the lights down. Wait until you feel sleepy and go back to bed.  Keep regular sleeping and waking hours. Avoid naps.  Exercise regularly.  Avoid distractions at bedtime. Distractions include watching television or engaging in any intense or detailed activity like attempting to balance the household checkbook.  Develop a bedtime ritual. Keep a familiar routine of bathing, brushing your teeth, climbing into bed at the same   time each night, listening to soothing music. Routines increase the success of falling to sleep faster.  Use relaxation techniques. This can be using breathing and muscle tension release routines. It can also include visualizing peaceful scenes. You can  also help control troubling or intruding thoughts by keeping your mind occupied with boring or repetitive thoughts like the old concept of counting sheep. You can make it more creative like imagining planting one beautiful flower after another in your backyard garden.  During your day, work to eliminate stress. When this is not possible use some of the previous suggestions to help reduce the anxiety that accompanies stressful situations. MAKE SURE YOU:   Understand these instructions.  Will watch your condition.  Will get help right away if you are not doing well or get worse. Document Released: 01/28/2000 Document Revised: 04/24/2011 Document Reviewed: 02/27/2007 Exit[employer] Patient Information 2015 [employer]. This information is not intended to replace advice given to you by your health care provider. Make sure you discuss any questions you have with your health care provider.  

## 2014-02-15 NOTE — MAU Provider Note (Signed)
History     CSN: [id number]  Arrival date and time: 02/15/14 1324   First Provider Initiated Contact with Patient 02/15/14 0915      Chief Complaint  Patient presents with  . Anxiety  . Insomnia   HPI Gabrielle Murphy 39 y.o. [id number] is 7 weeks postpartum and complaining of anxiety and insomnia.  Onset approx 2 weeks ago.  She was seen in clinic for pp visit 12/28.  She was started on Zoloft  with plan to increase to  as well as Buspar  TID PRN.  She noted an increase in anxiety and insomnia and felt it may be related to Buspar which she discontinued.  She then also d/c the zoloft but did restart yesterday.  She has not taken either medication today.  She reports she slept 2 hours last night and this is typical lately.  She has good support at home with husband as well as her mother.   Her husband is with her today and acknowledges that he has taken the baby out of the room so that Gerlean can sleep undisturbed and this has not been helpful.  She notes no particular worry and she denies thoughts of hurting herself or someone else.  She is very tearful and states she does cannot continue to exist this way.  She has extreme tension in her neck and shoulders.  She denies HA, weakness, SOB, chest pain, fever, nausea, vomiting, swelling in legs.  She reports she is eating well, exercising and getting out of the house with friends occasionally.  The baby is doing well = no longer as fussy as she once was.     OB History    Gravida Para Term Preterm AB TAB SAB Ectopic Multiple Living   0 3      Past Medical History  Diagnosis Date  . Hypothyroid     DX thru integrated Dr    Past Surgical History  Procedure Laterality Date  . Cholecystectomy    . Wisdom tooth extraction      Family History  Problem Relation Age of Onset  . Spina bifida Sister   . Cancer Maternal Grandmother     pancreatic  . Cancer Paternal Grandfather     esophagus  . Cancer Maternal  Grandmother   . Heart disease Maternal Grandmother     History  Substance Use Topics  . Smoking status: Never Smoker   . Smokeless tobacco: Never Used  . Alcohol Use: No    Allergies:  Allergies  Allergen Reactions  . Penicillins Rash    Facility-administered medications prior to admission  Medication Dose Route Frequency Provider Last Rate Last Dose  . Influenza vac split quadrivalent PF (FLUARIX) injection 0.5 mL  0.5 mL Intramuscular Once [place], CNM       Prescriptions prior to admission  Medication Sig Dispense Refill Last Dose  . acetaminophen (TYLENOL) 325 MG tablet Take 650 mg by mouth every 6 (six) hours as needed for mild pain.    Taking  . busPIRone (BUSPAR) 10 MG tablet Take 1 tablet (10 mg total) by mouth 3 (three) times daily as needed. 90 tablet 5   . ibuprofen (ADVIL,MOTRIN) 600 MG tablet Take 1 tablet (600 mg total) by mouth every 4 (four) hours as needed. 30 tablet 1 Taking  . Omega-3 Fatty Acids (FISH OIL PO) Take 1 tablet by mouth daily.   Taking  . Prenatal Multivit-Min-Fe-FA (PRENATAL VITAMINS PO) Take  1 tablet by mouth daily.    Taking  . sertraline (ZOLOFT) 25 MG tablet Take 1 tablet (25 mg total) by mouth daily. 7 tablet 0   . sertraline (ZOLOFT) 50 MG tablet Take 1 tablet (50 mg total) by mouth daily. 30 tablet 5     ROS Pertinent ROS in HPI  Physical Exam   Blood pressure 127/81, pulse 101, resp. rate 18, currently breastfeeding.  Physical Exam  Constitutional: She is oriented to person, place, and time. She appears well-developed and well-nourished.  HENT:  Head: Normocephalic and atraumatic.  Eyes: EOM are normal.  Neck: Normal range of motion.  Cardiovascular: Normal rate and regular rhythm.   Respiratory: Effort normal and breath sounds normal. No respiratory distress.  Musculoskeletal: Normal range of motion.  Neurological: She is alert and oriented to person, place, and time.  Skin: Skin is warm and dry.  Psychiatric:   Tearful.  Oriented    MAU Course  Procedures  MDM Discussed and developed plan with Dr. Erin Fulling.  Pt to have one xanax to help her anxiety today which should help her rest.  Will also use ambien  tablets to help her sleep better tonight.  Continue with zoloft.  Assessment and Plan  A:  1. Postpartum anxiety   2. Postpartum depression   3. Insomnia    P: Discharge to home Xanax 0.25mg   x 1 Ambien  x 10 tabs Precautions given.  Pt to only use medication if other responsible adult is present to care for infant and pt should need arise.  Sedation precautions given.  Pt to pump and dump breast milk after use of xanax. Hold zoloft today and restart.  Advised to increase dosing to  and maintain use of this medication as it takes some time with regular use to achieve benefit.  Follow up in clinic as needed/as scheduled Patient may return to MAU as needed or if her condition were to change or worsen  Continue exercise/am sunlight exposure/good self care.    Bertram Denver 02/15/2014, 9:38 AM

## 2014-02-15 NOTE — MAU Note (Signed)
Pt presents to MAU with complaints of anxiety and insomnia that has started recently. She recently was started on  Zoloft and Buspar. Pt delivered November the 14th vaginally.

## 2014-02-16 ENCOUNTER — Encounter: Payer: Self-pay | Admitting: Advanced Practice Midwife

## 2014-02-16 ENCOUNTER — Ambulatory Visit (INDEPENDENT_AMBULATORY_CARE_PROVIDER_SITE_OTHER): Payer: PRIVATE HEALTH INSURANCE | Admitting: Advanced Practice Midwife

## 2014-02-16 VITALS — BP 117/80 | HR 85 | Resp 16

## 2014-02-16 DIAGNOSIS — F411 Generalized anxiety disorder: Secondary | ICD-10-CM

## 2014-02-16 DIAGNOSIS — F53 Postpartum depression: Secondary | ICD-10-CM

## 2014-02-16 DIAGNOSIS — G4700 Insomnia, unspecified: Secondary | ICD-10-CM

## 2014-02-16 DIAGNOSIS — O99345 Other mental disorders complicating the puerperium: Secondary | ICD-10-CM

## 2014-02-16 DIAGNOSIS — F418 Other specified anxiety disorders: Secondary | ICD-10-CM

## 2014-02-16 MED ORDER — ALPRAZOLAM 0.25 MG PO TABS: 0.2500 mg | ORAL_TABLET | Freq: Three times a day (TID) | ORAL | Status: DC | PRN

## 2014-02-16 NOTE — Patient Instructions (Signed)
Insomnia Insomnia is frequent trouble falling and/or staying asleep. Insomnia can be a long term problem or a short term problem. Both are common. Insomnia can be a short term problem when the wakefulness is related to a certain stress or worry. Long term insomnia is often related to ongoing stress during waking hours and/or poor sleeping habits. Overtime, sleep deprivation itself can make the problem worse. Every little thing feels more severe because you are overtired and your ability to cope is decreased. CAUSES   Stress, anxiety, and depression.  Poor sleeping habits.  Distractions such as TV in the bedroom.  Naps close to bedtime.  Engaging in emotionally charged conversations before bed.  Technical reading before sleep.  Alcohol and other sedatives. They may make the problem worse. They can hurt normal sleep patterns and normal dream activity.  Stimulants such as caffeine for several hours prior to bedtime.  Pain syndromes and shortness of breath can cause insomnia.  Exercise late at night.  Changing time zones may cause sleeping problems (jet lag). It is sometimes helpful to have someone observe your sleeping patterns. They should look for periods of not breathing during the night (sleep apnea). They should also look to see how long those periods last. If you live alone or observers are uncertain, you can also be observed at a sleep clinic where your sleep patterns will be professionally monitored. Sleep apnea requires a checkup and treatment. Give your caregivers your medical history. Give your caregivers observations your family has made about your sleep.  SYMPTOMS   Not feeling rested in the morning.  Anxiety and restlessness at bedtime.  Difficulty falling and staying asleep. TREATMENT   Your caregiver may prescribe treatment for an underlying medical disorders. Your caregiver can give advice or help if you are using alcohol or other drugs for self-medication. Treatment  of underlying problems will usually eliminate insomnia problems.  Medications can be prescribed for short time use. They are generally not recommended for lengthy use.  Over-the-counter sleep medicines are not recommended for lengthy use. They can be habit forming.  You can promote easier sleeping by making lifestyle changes such as:  Using relaxation techniques that help with breathing and reduce muscle tension.  Exercising earlier in the day.  Changing your diet and the time of your last meal. No night time snacks.  Establish a regular time to go to bed.  Counseling can help with stressful problems and worry.  Soothing music and white noise may be helpful if there are background noises you cannot remove.  Stop tedious detailed work at least one hour before bedtime. HOME CARE INSTRUCTIONS   Keep a diary. Inform your caregiver about your progress. This includes any medication side effects. See your caregiver regularly. Take note of:  Times when you are asleep.  Times when you are awake during the night.  The quality of your sleep.  How you feel the next day. This information will help your caregiver care for you.  Get out of bed if you are still awake after 15 minutes. Read or do some quiet activity. Keep the lights down. Wait until you feel sleepy and go back to bed.  Keep regular sleeping and waking hours. Avoid naps.  Exercise regularly.  Avoid distractions at bedtime. Distractions include watching television or engaging in any intense or detailed activity like attempting to balance the household checkbook.  Develop a bedtime ritual. Keep a familiar routine of bathing, brushing your teeth, climbing into bed at the same   time each night, listening to soothing music. Routines increase the success of falling to sleep faster.  Use relaxation techniques. This can be using breathing and muscle tension release routines. It can also include visualizing peaceful scenes. You can  also help control troubling or intruding thoughts by keeping your mind occupied with boring or repetitive thoughts like the old concept of counting sheep. You can make it more creative like imagining planting one beautiful flower after another in your backyard garden.  During your day, work to eliminate stress. When this is not possible use some of the previous suggestions to help reduce the anxiety that accompanies stressful situations. MAKE SURE YOU:   Understand these instructions.  Will watch your condition.  Will get help right away if you are not doing well or get worse. Document Released: 01/28/2000 Document Revised: 04/24/2011 Document Reviewed: 02/27/2007 [employer] Patient Information 2015 [employer]. This information is not intended to replace advice given to you by your health care provider. Make sure you discuss any questions you have with your health care provider.  

## 2014-02-16 NOTE — Progress Notes (Signed)
   Subjective:    Patient ID: Gabrielle Murphy, female    DOB: Aug 30, 1975, 39 y.o.   MRN: [id number]  HPI: Pt is 8 weeks postpartum NSVD here for F/U of PP depression Started on Zoloft and Buspar. Feeling more anxious so stopped Buspar after a few days. States she has not been able to sleep for 6 days and nights. Feels panicked, very anxious. Denies SI, HI.   Seen in MAU for same Sx yesterday given one dose Zanax 0.25 mg which helped for about 4 hours and allowed to to rest. Rx'd Ambien which did npt help. Husband is with her, very supportive. He and Pt's mother have been taking turns helping with baby to give pt opportunity to rest w/out interruption, but she is still unable to. Pt denies worrying about husband and mother watching baby. It's sure what is triggering her anxiety and panic. Gets more anxious the more sleep she misses.   Review of Systems  Neg fever, chills, VB, SOB, chest pain.      Objective:   Physical Exam  BP 117/80 mmHg  Pulse 85  Resp 16  Breastfeeding? No Pt tearful, downward-cast gaze. A&Ox4. Answeres questions appropriately. Anxious-appearing.     Assessment & Plan:  1. Postpartum depression Continue Zoloft consistently. May take 4-6 weeks to work and may need dose increase - ALPRAZolam (XANAX) 0.25 MG tablet; Take 1 tablet (0.25 mg total) by mouth 3 (three) times daily as needed for anxiety (Take two tablets at night  time dose.).  Dispense: 30 tablet; Refill: 1 - Ambulatory referral to Behavioral Health - Have husband or mother w/ pt at all times next few days to allow her to rest if the opportunity arrises and to be allert for signs of worsening PP depression or development of psychosis.  [facility] for mental [facility] emergency - May need to consider referral to [facility] Inpatient PP depression unit if OP care not adequate.  - Encouraged taking walks, light exercise, sunlight  2. Insomnia  - ALPRAZolam (XANAX) 0.25 MG tablet; Take 1 tablet (0.25 mg total) by  mouth 3 (three) times daily as needed for anxiety (Take two tablets at night  time dose.).  Dispense: 30 tablet; Refill: 1 - Ambulatory referral to Behavioral Health - Meditation, relaxation discussed.

## 2014-02-19 ENCOUNTER — Encounter (HOSPITAL_COMMUNITY): Payer: Self-pay | Admitting: Physician Assistant

## 2014-02-19 ENCOUNTER — Ambulatory Visit (INDEPENDENT_AMBULATORY_CARE_PROVIDER_SITE_OTHER): Payer: PRIVATE HEALTH INSURANCE | Admitting: Physician Assistant

## 2014-02-19 VITALS — BP 113/77 | HR 80 | Ht 66.0 in | Wt 142.0 lb

## 2014-02-19 DIAGNOSIS — F329 Major depressive disorder, single episode, unspecified: Secondary | ICD-10-CM

## 2014-02-19 DIAGNOSIS — F53 Postpartum depression: Secondary | ICD-10-CM | POA: Insufficient documentation

## 2014-02-19 DIAGNOSIS — F5105 Insomnia due to other mental disorder: Secondary | ICD-10-CM

## 2014-02-19 DIAGNOSIS — O99345 Other mental disorders complicating the puerperium: Secondary | ICD-10-CM

## 2014-02-19 DIAGNOSIS — G4700 Insomnia, unspecified: Secondary | ICD-10-CM

## 2014-02-19 HISTORY — DX: Postpartum depression: F53.0

## 2014-02-19 HISTORY — DX: Insomnia due to other mental disorder: F51.05

## 2014-02-19 MED ORDER — TRAZODONE HCL 50 MG PO TABS: 50.0000 mg | ORAL_TABLET | Freq: Every day | ORAL | Status: DC

## 2014-02-19 NOTE — Patient Instructions (Signed)
1. Continue all medication as ordered. 2. Call this office if you have any questions or concerns. 3. Continue to get regular exercise 3-5 times a week. 4. Continue to eat a healthy nutritionally balanced diet. 5. Continue to reduce stress and anxiety through activities such as yoga, mindfulness, meditation and or prayer. 6. Keep all appointments with your out patient therapist and have notes forwarded to this office. (If you do not have one and would like to be scheduled with a therapist, please let our office assist you with this. 7. Follow up as planned 2 weeks. 

## 2014-02-19 NOTE — Progress Notes (Signed)
Psychiatric Assessment Adult  Patient Identification:  Gabrielle Murphy Date of Evaluation:  02/19/2014 Chief Complaint: insomnia and post partum depression History of Chief Complaint:   Chief Complaint  Patient presents with  . Anxiety  . Other    Pt is not sleeping    HPI Comments: Patient is a 39 year old MWF who presents with 9 weeks post partum with her third child reporting insomnia and anxiety. This was a hard pregnancy compared to her previous two, she has children 6873yrs old and 37 years old. She had a great deal of morning sickness with this pregnancy, also a stomach flu as well. She was 41 weeks at delivery, having had version for a breech presentation at 38 weeks.  She was induced for this pregnancy as she was the others, and is not currently breast feeding due to being started on medication. At 6-7 weeks [facility] reporting crying spells, and overwhelming since of gloom. She was started on Zoloft and immediately had problems sleeping with in the first several days. She stopped it after several days and slept better. She is using xanax 0.25 in the AM and 0.5mg  at hs.  She is able to take cat naps but the baby is not sleeping through the night yet. Her husband is helpful and she can't recall any new stressors.  The patient notes that she just can't get to sleep. She feels constantly anxious through out the day and reports feeling a sense of anxiety with racing thoughts at night when she tries to sleep.  She also notes that she has lost 8 pounds since last week but continues to eat healthy meals. Her mother is there helping her as well.  She denies any SI/HI or AVH.  Anxiety     Review of Systems Physical Exam  Depressive Symptoms: depressed mood, insomnia, psychomotor agitation, anxiety, panic attacks, insomnia, loss of energy/fatigue, disturbed sleep,  (Hypo) Manic Symptoms:   Elevated Mood:  No Irritable Mood:  No Grandiosity:  No Distractibility:   No Labiality of Mood:  No Delusions:  No Hallucinations:  No Impulsivity:  No Sexually Inappropriate Behavior:  No Financial Extravagance:  No Flight of Ideas:  No  Anxiety Symptoms: Excessive Worry:  Yes Panic Symptoms:  Yes Agoraphobia:  No Obsessive Compulsive: No  Symptoms:  Specific Phobias:  No Social Anxiety:  No  Psychotic Symptoms:  Hallucinations: No  Delusions:  No Paranoia:  No   Ideas of Reference:  No  PTSD Symptoms: Ever had a traumatic exposure:  No Had a traumatic exposure in the last month:  No Re-experiencing:   Hypervigilance:  No Hyperarousal: No None Avoidance: No None  Traumatic Brain Injury: No   Past Psychiatric History: Diagnosis: none  Hospitalizations: Child birth only  Outpatient Care: none  Substance Abuse Care: none  Self-Mutilation: none  Suicidal Attempts: none  Violent Behaviors: none   Past Medical History:   Past Medical History  Diagnosis Date  . Hypothyroid     DX thru integrated Dr  . Lethea KillingsAnxiety   . Depression    History of Loss of Consciousness:  No Seizure History:  No Cardiac History:  No Allergies:   Allergies  Allergen Reactions  . Penicillins Rash   Current Medications:  Current Outpatient Prescriptions  Medication Sig Dispense Refill  . acetaminophen (TYLENOL) 325 MG tablet Take 650 mg by mouth every 6 (six) hours as needed for mild pain.     Marland Kitchen. ALPRAZolam (XANAX) 0.25 MG tablet Take  1 tablet (0.25 mg total) by mouth 3 (three) times daily as needed for anxiety (Take two tablets at night  time dose.). 30 tablet 1  . ibuprofen (ADVIL,MOTRIN) 600 MG tablet Take 1 tablet (600 mg total) by mouth every 4 (four) hours as needed. 30 tablet 1  . Prenatal Multivit-Min-Fe-FA (PRENATAL VITAMINS PO) Take 1 tablet by mouth daily.     . sertraline (ZOLOFT) 50 MG tablet Take 1 tablet (50 mg total) by mouth daily. 30 tablet 5   No current facility-administered medications for this visit.    Previous Psychotropic  Medications:  Medication Dose   zoloft                       Substance Abuse History in the last 12 months: Medical Consequences of Substance Abuse: na  Legal Consequences of Substance Abuse: na  Family Consequences of Substance Abuse: na  Blackouts:  NA DT's:  NA Withdrawal Symptoms:  NA   Social History: Current Place of Residence: [place] Place of Birth: [place] Family Members: Husband 3 children Marital Status:  Married Children: 3 Sons: 1 Daughters: 2 Relationships:  Education:  Corporate treasurer Problems/Performance:  Religious Beliefs/Practices: Christian History of Abuse: none Teacher, music History:  None. Legal History: none Hobbies/Interests:   Family History:   Family History  Problem Relation Age of Onset  . Spina bifida Sister   . Cancer Maternal Grandmother     pancreatic  . Cancer Paternal Grandfather     esophagus  . Cancer Maternal Grandmother   . Heart disease Maternal Grandmother     Mental Status Examination/Evaluation: Objective:  Appearance: Casual  Eye Contact::  Good  Speech:  Clear and Coherent  Volume:  Normal  Mood:  Anxious and depressed  Affect:  Congruent  Thought Process:  Coherent and Goal Directed  Orientation:  Full (Time, Place, and Person)  Thought Content:  WDL  Suicidal Thoughts:  No  Homicidal Thoughts:  No  Judgement:  Good  Insight:  Present  Psychomotor Activity:  Normal  Akathisia:  No  Handed:  Right  AIMS (if indicated):    Assets:  Communication Skills Desire for Improvement Financial Resources/Insurance Housing Leisure Time Physical Health Resilience Social Support Talents/Skills Transportation    Laboratory/X-Ray Psychological Evaluation(s)        Assessment:    AXIS I Post partum depression, 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 Hypothyroid     DX thru integrated Dr  . Lethea Killings    . Depression      AXIS IV other psychosocial or environmental problems and problems related to social environment  AXIS V 51-60 moderate symptoms   Treatment Plan/Recommendations:  Plan of Care:  Trazodone for sleep, continue Zoloft  Laboratory:  None currently  Psychotherapy: not at this time  Medications: will d/c xanax, add trazodone, continue zoloft, add D3 and Bcomplex  Routine PRN Medications:  No  Consultations:   Safety Concerns:  None at this time.  Other:     Rona Ravens. Etan Vasudevan RPAC 3:06 PM 02/19/2014

## 2014-02-20 ENCOUNTER — Telehealth (HOSPITAL_COMMUNITY): Payer: Self-pay

## 2014-02-20 NOTE — Telephone Encounter (Signed)
PT was not able to sleep last night. She would like to speak to you about the medication.

## 2014-02-20 NOTE — Telephone Encounter (Signed)
02/20/2014 Patient called to say that she had broken sleep last night, 1-2 hours asleep then awake for 2 hours then slept 1-2 hours. She is reading on the internet and feels that the Zoloft could be doing this to her. She is anxious and tearful and continues to have unrealistic expectations about what her body can do at this point. She is reminded that she had a "miserable pregnancy," that this was an "unplanned pregnancy" and she is also no longer able to breast feed due to starting the medication. She is reminded to be realistic with her expectations, and to give this more time to work.  She is advised to take 100mg  of Trazodone tonight at hs. And she may increase by 1 tablet each night up to a total dose of 200mg  at hs.  She would also like to decrease her Zoloft back to 25mg  which is fine with me, but she is advised to continue taking it as it will help with her symptoms if she can take it longer than 5 days.  She can also take 50mg  of Benadryl for sleep if she should wake during the night.  She will do this and call me on Tuesday to let me know her progress.  She is also advised to use more positive self talk rather than continuing to self sabotage by being negative.  She is also advised to stop ruminating about side effects on the internet. Rona RavensNeil T. Calan Doren RPAC 4:02 PM 02/20/2014

## 2014-02-24 ENCOUNTER — Telehealth (HOSPITAL_COMMUNITY): Payer: Self-pay | Admitting: Physician Assistant

## 2014-02-24 NOTE — Telephone Encounter (Signed)
Patient called as instructed regarding her sleeping schedule. She is taking 150mg  of Trazodone and Benadryl if she wakes during the night. She is reporting that she is 80% better and is much more functional than she was last week. The patient is grateful for the help this provider offered her and she will keep her follow up appointment. Rona RavensNeil T. Daziya Redmond RPAC 1:17 PM 02/24/2014

## 2014-02-26 ENCOUNTER — Other Ambulatory Visit (HOSPITAL_COMMUNITY): Payer: Self-pay | Admitting: Physician Assistant

## 2014-02-26 DIAGNOSIS — F5105 Insomnia due to other mental disorder: Secondary | ICD-10-CM

## 2014-02-26 NOTE — Telephone Encounter (Signed)
PT would like to speak to you about taking both the trazodone (200mg ) and the benadryl in the evenings. She is still having severe anxiety and isn't sure what to do.

## 2014-03-02 NOTE — Telephone Encounter (Signed)
Per Verne SpurrNeil Mashburn, PA-C, pt is authorized for 3 day supply for Trazodone 50mg , Qty 10. Phone into [facility]. Pt has appt on 1/21. Pt states and shows understanding.

## 2014-03-05 ENCOUNTER — Encounter (INDEPENDENT_AMBULATORY_CARE_PROVIDER_SITE_OTHER): Payer: Self-pay

## 2014-03-05 ENCOUNTER — Encounter (HOSPITAL_COMMUNITY): Payer: Self-pay | Admitting: Physician Assistant

## 2014-03-05 ENCOUNTER — Ambulatory Visit (INDEPENDENT_AMBULATORY_CARE_PROVIDER_SITE_OTHER): Payer: PRIVATE HEALTH INSURANCE | Admitting: Physician Assistant

## 2014-03-05 VITALS — BP 106/61 | HR 83 | Ht 66.0 in | Wt 148.0 lb

## 2014-03-05 DIAGNOSIS — G4700 Insomnia, unspecified: Secondary | ICD-10-CM

## 2014-03-05 DIAGNOSIS — F53 Postpartum depression: Secondary | ICD-10-CM

## 2014-03-05 DIAGNOSIS — F419 Anxiety disorder, unspecified: Secondary | ICD-10-CM

## 2014-03-05 DIAGNOSIS — F5105 Insomnia due to other mental disorder: Secondary | ICD-10-CM

## 2014-03-05 DIAGNOSIS — O99345 Other mental disorders complicating the puerperium: Principal | ICD-10-CM

## 2014-03-05 DIAGNOSIS — F329 Major depressive disorder, single episode, unspecified: Secondary | ICD-10-CM

## 2014-03-05 MED ORDER — SERTRALINE HCL 50 MG PO TABS: 50.0000 mg | ORAL_TABLET | Freq: Every day | ORAL | Status: DC

## 2014-03-05 MED ORDER — QUETIAPINE FUMARATE 50 MG PO TABS: 50.0000 mg | ORAL_TABLET | Freq: Every day | ORAL | Status: DC

## 2014-03-05 MED ORDER — TRAZODONE HCL 50 MG PO TABS: ORAL_TABLET | ORAL | Status: DC

## 2014-03-05 NOTE — Progress Notes (Signed)
  [id number]  Gabrielle Murphy [id number] 39 y.o.  03/05/2014 1:32 PM  Chief Complaint: follow up on insomnia   History of Present Illness: Patient states she is doing better but still feels that she is having significant anxiety and can not "get the edge off"  She is sleeping better in longer stretches but has not yet had 8 straight hours.  Suicidal Ideation: No Plan Formed: No Patient has means to carry out plan: No  Homicidal Ideation: No Plan Formed: No Patient has means to carry out plan: No  Review of Systems: Psychiatric: Agitation: No Hallucination: No Depressed Mood: Yes 1/10 Insomnia: Yes Hypersomnia: No Altered Concentration: No Feels Worthless: No Grandiose Ideas: No Belief In Special Powers: No New/Increased Substance Abuse: No Compulsions: No  Neurologic: Headache: No Seizure: No Paresthesias: No  Past Medical Family, Social History: her mother has hypothyroidism, but takes a supplement, rather than a biological.  Outpatient Encounter Prescriptions as of 03/05/2014  Medication Sig  . acetaminophen (TYLENOL) 325 MG tablet Take 650 mg by mouth every 6 (six) hours as needed for mild pain.   . cholecalciferol (VITAMIN D) 1000 UNITS tablet Take 1,000 Units by mouth daily.  Marland Kitchen. ibuprofen (ADVIL,MOTRIN) 600 MG tablet Take 1 tablet (600 mg total) by mouth every 4 (four) hours as needed.  . Prenatal Multivit-Min-Fe-FA (PRENATAL VITAMINS PO) Take 1 tablet by mouth daily.   . sertraline (ZOLOFT) 50 MG tablet Take 1 tablet (50 mg total) by mouth daily. (Patient taking differently: Take 25 mg by mouth daily. )  . traZODone (DESYREL) 50 MG tablet TAKE 1 TABLET (50 MG TOTAL) BY MOUTH AT BEDTIME.  . vitamin B-12 (CYANOCOBALAMIN) 1000 MCG tablet Take 1,000 mcg by mouth daily.  . [DISCONTINUED] ALPRAZolam (XANAX) 0.25 MG tablet Take 1 tablet (0.25 mg total) by mouth 3 (three) times daily as needed for anxiety (Take two tablets at night  time  dose.). (Patient not taking: Reported on 03/05/2014)    Past Psychiatric History/Hospitalization(s):  Anxiety: Yes 7-8/10 Bipolar Disorder: No Depression: No Mania: No Psychosis: No Schizophrenia: No Personality Disorder: No Hospitalization for psychiatric illness: No History of Electroconvulsive Shock Therapy: No Prior Suicide Attempts: No  Physical Exam: Constitutional:  BP 106/61 mmHg  Pulse 83  Ht 5\' 6"  (1.676 m)  Wt 148 lb (67.132 kg)  BMI 23.90 kg/m2  General Appearance: alert, oriented, no acute distress  Musculoskeletal: Strength & Muscle Tone: within normal limits Gait & Station: normal Patient leans: N/A  Psychiatric: Speech (describe rate, volume, coherence, spontaneity, and abnormalities if any): none  Thought Process (describe rate, content, abstract reasoning, and computation): none  Associations: Coherent and Relevant  Thoughts: normal  Mental Status: Orientation: oriented to person, place and time/date Mood & Affect: normal affect Attention Span & Concentration: fair  Medical Decision Making (Choose Three): Established Problem, Stable/Improving (1)  Assessment: Axis I: PP depression with insomnia and anxiety   Plan:  1. Will increase Zoloft to 50mg  a day. 2. Will Seroquel 50mg  at hs for sleep and anxiety. 3. Will give trazodone to use if needed. 4. Follow up in 3 weeks. 5. Will hold off on TSH at this time due to post partum changes to avoid a false low or high. Genevieve Arbaugh, PA-C 03/05/2014

## 2014-03-17 ENCOUNTER — Other Ambulatory Visit (HOSPITAL_COMMUNITY): Payer: Self-pay | Admitting: Physician Assistant

## 2014-03-26 ENCOUNTER — Ambulatory Visit (HOSPITAL_COMMUNITY): Payer: Self-pay | Admitting: Physician Assistant

## 2014-03-30 ENCOUNTER — Other Ambulatory Visit: Payer: Self-pay | Admitting: Advanced Practice Midwife

## 2014-04-10 ENCOUNTER — Ambulatory Visit: Payer: Self-pay | Admitting: Family

## 2014-04-10 DIAGNOSIS — Z09 Encounter for follow-up examination after completed treatment for conditions other than malignant neoplasm: Secondary | ICD-10-CM

## 2015-11-12 IMAGING — US US OB FOLLOW-UP
1 series · 12 of 28 positions shown · non-contrast
Comparison: none

OBSTETRICS REPORT
                      (Signed Final 09/09/2013 [DATE])

Service(s) Provided
 US OB FOLLOW UP                                       76816.1
Indications
 Advanced maternal age (AMA), Multigravida (38)
 Follow-up incomplete fetal anatomic evaluation
 Hypothyroid
Fetal Evaluation
 Num Of Fetuses:    1
 Fetal Heart Rate:  147                          bpm
 Cardiac Activity:  Observed
 Placenta:          Anterior Fundal, above
                    cervical os
 P. Cord            Previously Visualized
 Insertion:
 Amniotic Fluid
 AFI FV:      Subjectively within normal limits
                                             Larg Pckt:     6.2  cm
Biometry
 BPD:     65.9  mm     G. Age:  26w 4d                CI:         76.5   70 - 86
 OFD:     86.1  mm                                    FL/HC:      18.5   18.7 -
 HC:     243.4  mm     G. Age:  26w 3d       63  %    HC/AC:      1.16   1.04 -
 AC:     209.6  mm     G. Age:  25w 4d       44  %    FL/BPD:     68.4   71 - 87
 FL:      45.1  mm     G. Age:  25w 0d       22  %    FL/AC:      21.5   20 - 24
 HUM:     42.6  mm     G. Age:  25w 4d       47  %
 Est. FW:     809  gm    1 lb 13 oz      53  %
Gestational Age
 LMP:           25w 3d        Date:  03/15/13                 EDD:   12/20/13
 U/S Today:     25w 6d                                        EDD:   12/17/13
 Best:          25w 3d     Det. By:  LMP  (03/15/13)          EDD:   12/20/13
Anatomy
 Cranium:          Appears normal         Aortic Arch:      Appears normal
 Fetal Cavum:      Appears normal         Ductal Arch:      Previously seen
 Ventricles:       Appears normal         Diaphragm:        Appears normal
 Choroid Plexus:   Previously seen        Stomach:          Appears normal, left
                                                            sided
 Cerebellum:       Previously seen        Abdomen:          Appears normal
 Posterior Fossa:  Previously seen        Abdominal Wall:   Previously seen
 Nuchal Fold:      Previously seen        Cord Vessels:     Appears normal (3
                                                            vessel cord)
 Face:             Orbits and profile     Kidneys:          Appear normal
                   previously seen
 Lips:             Appears normal         Bladder:          Appears normal
 Heart:            Appears normal         Spine:            Appears normal
                   (4CH, axis, and
                   situs)
 RVOT:             Appears normal         Lower             Previously seen
                                          Extremities:
 LVOT:             Appears normal         Upper             Previously seen
 Other:  Female gender. Heels and 5th digit previously seen.
Cervix Uterus Adnexa
 Cervical Length:    2.9      cm
 Cervix:       Normal appearance by transabdominal scan. Appears
               closed, without funnelling.
Impression
INDICATION: 38 yr old [id number] at 65w0d for follow up
 ultrasound to complete anatomy.

[Series 1: us ob follow-up · 0.23mm/px · 12 of 43 slices shown]
[im 2/43]
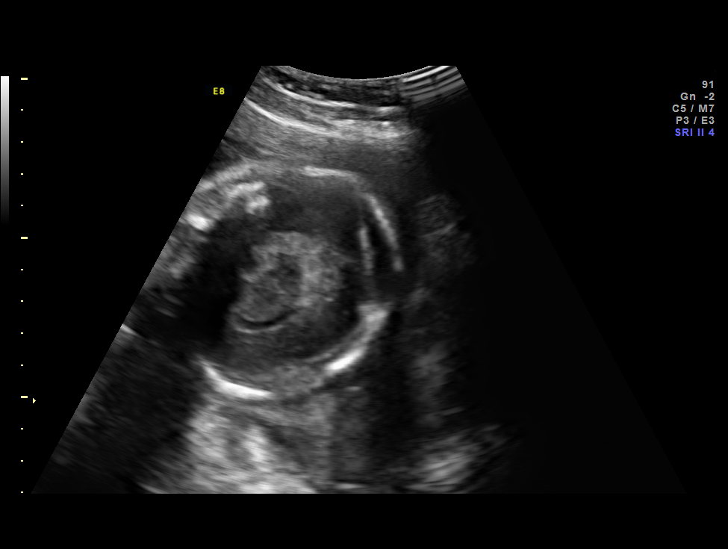
[im 5/43]
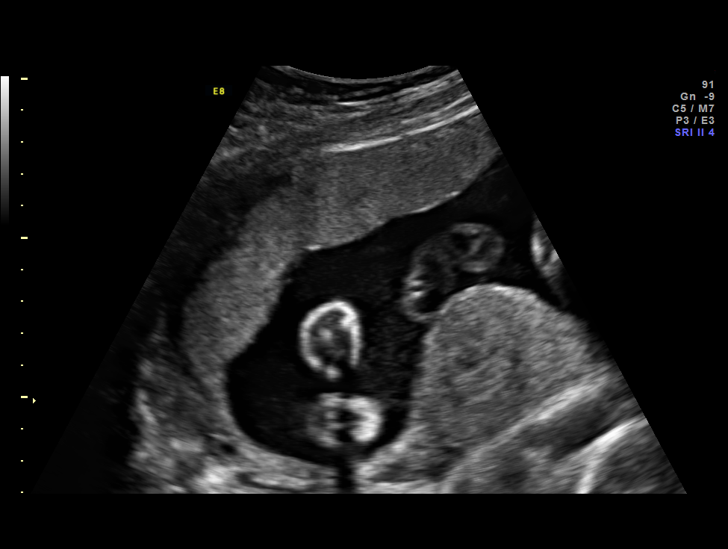
[im 8/43]
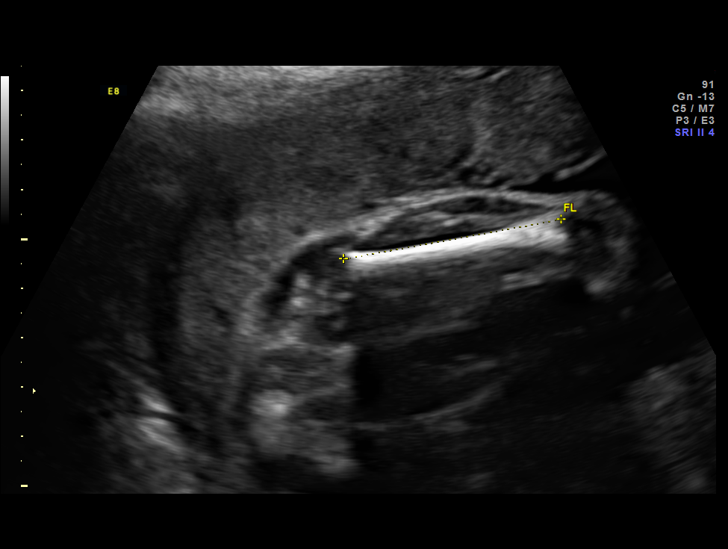
[im 13/43]
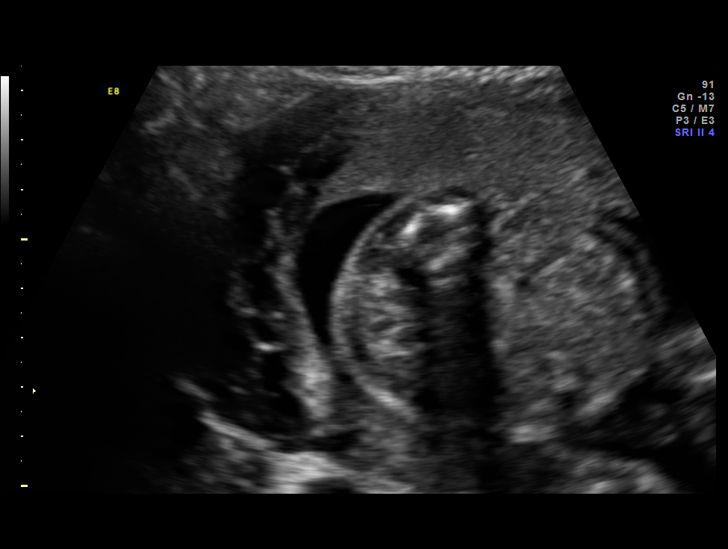
[im 16/43]
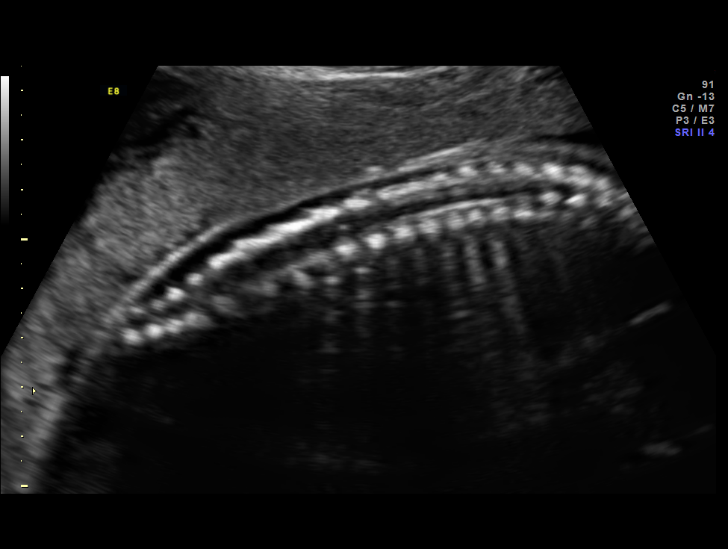
[im 19/43]
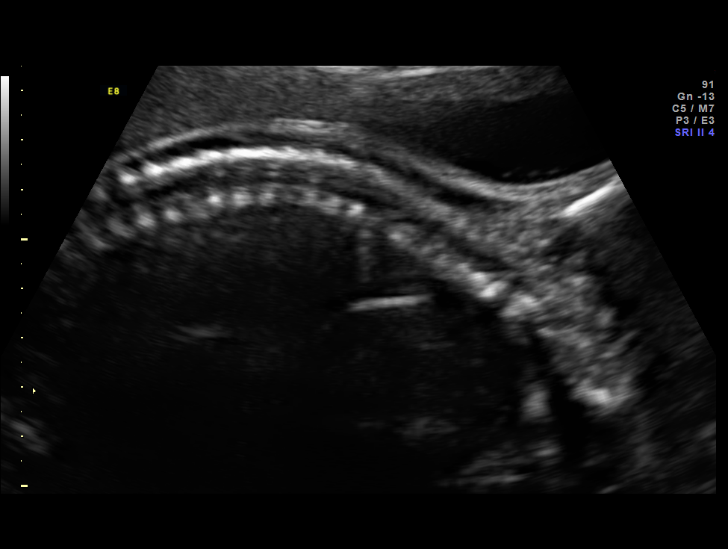
[im 24/43]
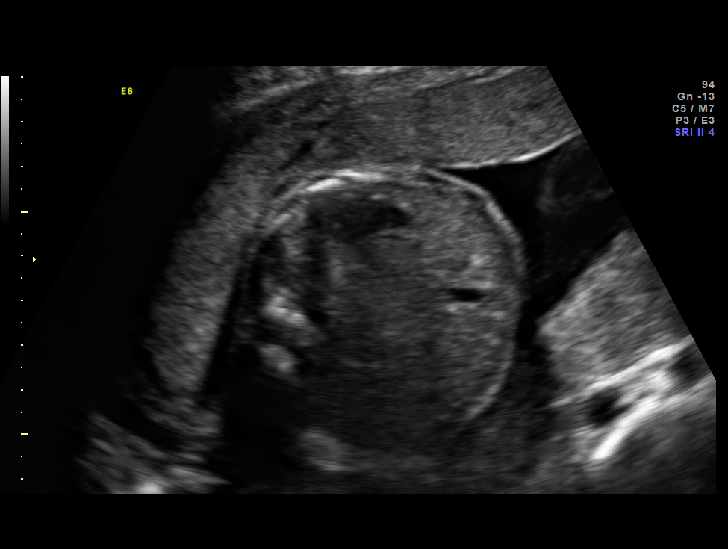
[im 27/43]
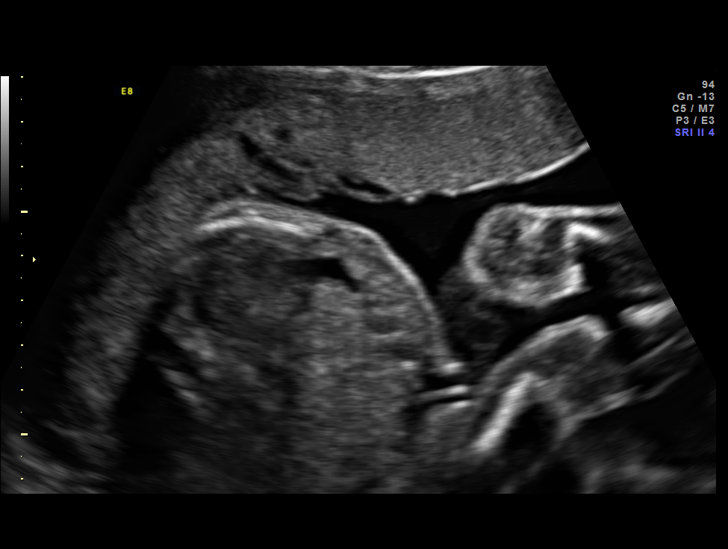
[im 30/43]
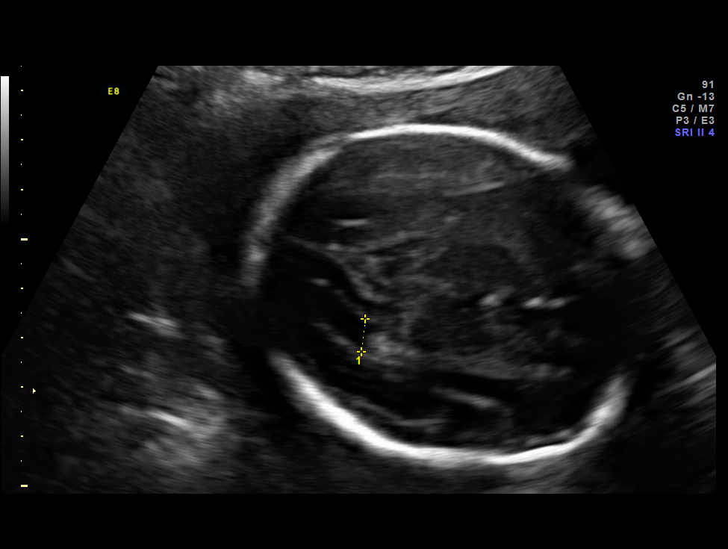
[im 35/43]
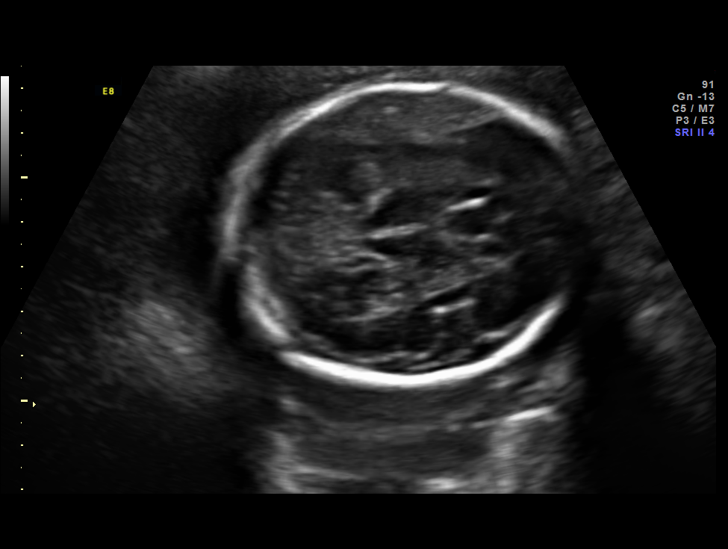
[im 38/43]
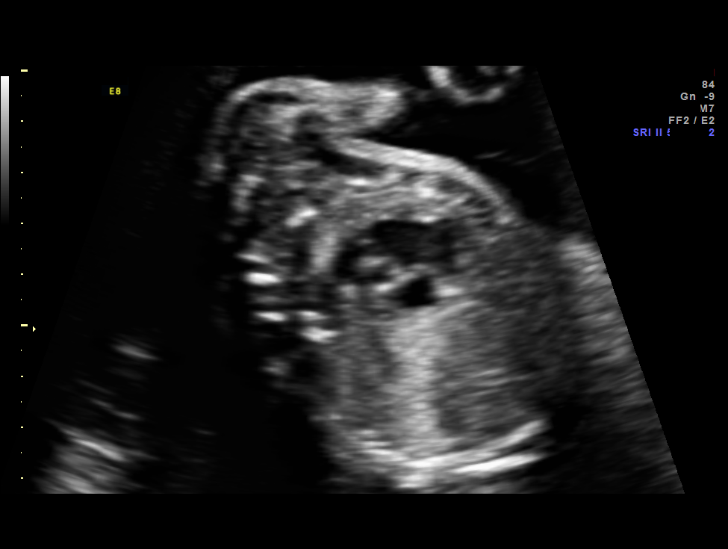
[im 41/43]
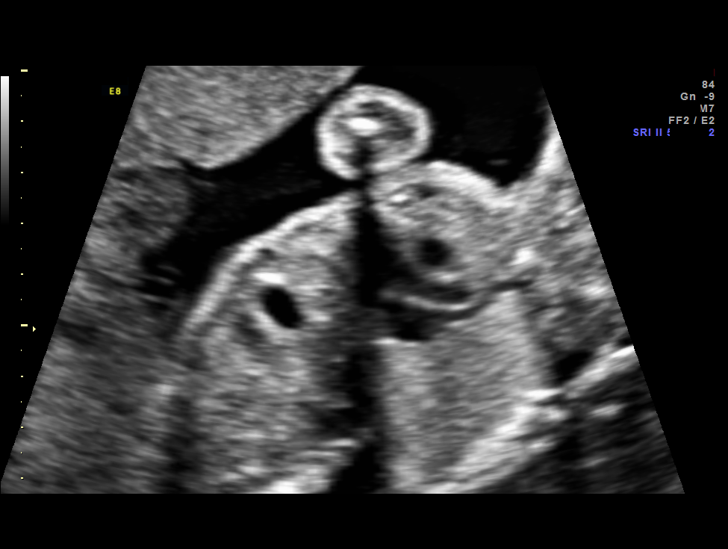

[12 of 28 positions shown; findings below may reference images not displayed]

FINDINGS: 1. Single intrauterine pregnancy.
 2. Estimated fetal weight is in the 53rd%.
 3. Anterior fundal placenta without evidence of previa.
 4. Normal amniotic fluid volume.
 5. Normal transabdominal cervical length.
 6. The limited anatomy survey is normal. Any anatomy not
 evaluated on today's exam was evaluated on the previous
 exam.
Recommendations

 1. Appropriate fetal growth.
 2. Fetal anatomic survey is complete.
 3. Advanced maternal age:
 - patient previously declined genetic counseling and
 aneuploidy screening
 4. Recommend follow up ultrasounds as clinically indicated

## 2021-03-20 ENCOUNTER — Emergency Department (INDEPENDENT_AMBULATORY_CARE_PROVIDER_SITE_OTHER): Payer: BLUE CROSS/BLUE SHIELD

## 2021-03-20 ENCOUNTER — Encounter: Payer: Self-pay | Admitting: Emergency Medicine

## 2021-03-20 ENCOUNTER — Emergency Department
Admission: EM | Admit: 2021-03-20 | Discharge: 2021-03-20 | Disposition: A | Discharge status: Home / Self Care | Payer: No Typology Code available for payment source | Source: Home / Self Care | Attending: Family Medicine | Admitting: Family Medicine

## 2021-03-20 DIAGNOSIS — M7750 Other enthesopathy of unspecified foot: Secondary | ICD-10-CM | POA: Diagnosis not present

## 2021-03-20 DIAGNOSIS — M79672 Pain in left foot: Secondary | ICD-10-CM | POA: Diagnosis not present

## 2021-03-20 MED ORDER — METHYLPREDNISOLONE 4 MG PO TBPK: ORAL_TABLET | ORAL | 0 refills | Status: AC

## 2021-03-20 NOTE — ED Provider Notes (Signed)
[facility]    CSN: [id number] Arrival date & time: 03/20/21  0947      History   Chief Complaint Chief Complaint  Patient presents with   Foot Pain    Left     HPI Gabrielle Murphy is a 46 y.o. female.   HPI  Patient works as a Systems analyst.  She runs a lot.  She states that she did 8 mile run few days ago.  She states that after this her left foot started hurting.  She now can barely bear weight.  She is concerned she has a stress fracture.  She would like an x-ray.  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 Authorizing Provider  Albuterol Sulfate (PROAIR RESPICLICK) 108 (90 Base) MCG/ACT AEPB Inhale into the lungs. 06/02/14  Yes [provider]  methylPREDNISolone (MEDROL DOSEPAK) 4 MG TBPK tablet tad 03/20/21  Yes Eustace Moore, MD  venlafaxine [facility]) 37.5 MG tablet Take by mouth.    [provider]    Family History Family History  Problem Relation Age of Onset   Breast cancer Mother    Healthy Father    Spina bifida Sister    Cancer Maternal Grandmother         pancreatic   Heart disease Maternal Grandmother    Bipolar disorder Maternal Grandmother    Cancer Paternal Grandfather        esophagus    Social History Social History   Tobacco Use   Smoking status: Never    Passive exposure: Never   Smokeless tobacco: Never  Vaping Use   Vaping Use: Never used  Substance Use Topics   Alcohol use: No   Drug use: No     Allergies   Penicillins   Review of Systems Review of Systems See HPI  Physical Exam Triage Vital Signs ED Triage Vitals  Enc Vitals Group     BP 03/20/21 0958 113/75     Pulse Rate 03/20/21 0958 (!) 59     Resp 03/20/21 0958 15     Temp 03/20/21 0958 99.2 F (37.3 C)     Temp Source 03/20/21 0958 Oral     SpO2 03/20/21 0958 99 %     Weight 03/20/21 1000 147  lb (66.7 kg)     Height 03/20/21 1000 5\' 6"  (1.676 m)     Head Circumference --      Peak Flow --      Pain Score 03/20/21 0959 4     Pain Loc --      Pain Edu? --      Excl. in GC? --    No data found.  Updated Vital Signs BP 113/75 (BP Location: Left Arm)    Pulse (!) 59    Temp 99.2 F (37.3 C) (Oral)    Resp 15    Ht 5\' 6"  (1.676 m)    Wt 66.7 kg    LMP 03/05/2021 (Exact Date)    SpO2 99%    BMI 23.73 kg/m      Physical Exam Constitutional:      General: She is not in acute distress.    Appearance: She is well-developed and normal weight.  HENT:     Head: Normocephalic and atraumatic.  Eyes:     Conjunctiva/sclera: Conjunctivae normal.     Pupils: Pupils are equal, round, and reactive to light.  Cardiovascular:     Rate and Rhythm: Normal rate.  Pulmonary:     Effort: Pulmonary effort is normal. No respiratory distress.  Abdominal:     General: There is no distension.     Palpations: Abdomen is soft.  Musculoskeletal:        General: Normal range of motion.     Cervical back: Normal range of motion.  Feet:     Comments: There is tenderness to palpation of midfoot on dorsal surface.  Pain with ankle movement.  Pain with toe  movement.  No pain with palpation of metatarsals or with squeeze pressure across metatarsals.  There is mild soft tissue swelling dorsum of foot. Skin:    General: Skin is warm and dry.  Neurological:     Mental Status: She is alert.     UC Treatments / Results  Labs (all labs ordered are listed, but only abnormal results are displayed) Labs Reviewed - No data to display  EKG   Radiology DG Foot Complete Left  Result Date: 03/20/2021 CLINICAL DATA:  Pain across the top of the foot. Long distance runner. Recently ran 8 miles. Concern for stress fracture. EXAM: LEFT FOOT - COMPLETE 3+ VIEW COMPARISON:  None. FINDINGS: No fracture or dislocation. Joint spaces are preserved. No significant hallux valgus deformity. No erosions. A pes cavus deformity suspected on this nonweightbearing radiograph. Tiny plantar calcaneal spur. Regional soft tissues appear normal. No radiopaque foreign body. IMPRESSION: 1. No acute findings. 2. Pes cavus deformity suspected on this nonweightbearing radiograph. 3. Tiny plantar calcaneal spur. Electronically Signed   By: 03/07/2021 M.D.   On: 03/20/2021 10:24    Procedures Procedures (including critical care time)  Medications Ordered in UC Medications - No data to display  Initial Impression / Assessment and Plan / UC Course  I have reviewed the triage vital signs and the nursing notes.  Pertinent labs & imaging results that were available during my care of the patient were reviewed by me and considered in my medical decision making (see chart for details).    Final Clinical Impressions(s) / UC Diagnoses   Final diagnoses:  Foot tendinitis  Foot pain, left     Discharge Instructions      Take the Medrol Dosepak as directed Take all of day 1 today When you have finished the Medrol Dosepak take Aleve  2 in the morning and 2 at night until your foot pain improves Limit activity while your foot is painful Follow-up with sports medicine   ED  Prescriptions     Medication Sig Dispense Auth. Provider   methylPREDNISolone (MEDROL DOSEPAK) 4 MG TBPK tablet tad 21 tablet Eustace Moore, MD      PDMP not reviewed this encounter.   Eustace Moore, MD 03/20/21 1048

## 2021-03-20 NOTE — ED Triage Notes (Signed)
Pain to top of left foot  Concerns for a stress fracture  Long distance runner- ran 8 miles last Sunday  Pain started on wed RICE at home Tylenol & ibuprofen - none today

## 2021-03-20 NOTE — Discharge Instructions (Signed)
Take the Medrol Dosepak as directed Take all of day 1 today When you have finished the Medrol Dosepak take Aleve 2 in the morning and 2 at night until your foot pain improves Limit activity while your foot is painful Follow-up with sports medicine
